# Patient Record
Sex: Male | Born: 1950
Health system: Southern US, Community
[De-identification: ages and names within clinical notes are randomized; demographics above are authoritative.]

## PROBLEM LIST (undated history)

## (undated) DIAGNOSIS — F329 Major depressive disorder, single episode, unspecified: Secondary | ICD-10-CM

## (undated) DIAGNOSIS — M1990 Unspecified osteoarthritis, unspecified site: Secondary | ICD-10-CM

## (undated) DIAGNOSIS — H269 Unspecified cataract: Secondary | ICD-10-CM

## (undated) DIAGNOSIS — D689 Coagulation defect, unspecified: Secondary | ICD-10-CM

## (undated) DIAGNOSIS — K219 Gastro-esophageal reflux disease without esophagitis: Secondary | ICD-10-CM

## (undated) DIAGNOSIS — Z5189 Encounter for other specified aftercare: Secondary | ICD-10-CM

## (undated) DIAGNOSIS — E7800 Pure hypercholesterolemia, unspecified: Secondary | ICD-10-CM

## (undated) DIAGNOSIS — T7840XA Allergy, unspecified, initial encounter: Secondary | ICD-10-CM

## (undated) DIAGNOSIS — I219 Acute myocardial infarction, unspecified: Secondary | ICD-10-CM

## (undated) DIAGNOSIS — F419 Anxiety disorder, unspecified: Secondary | ICD-10-CM

## (undated) DIAGNOSIS — Q600 Renal agenesis, unilateral: Secondary | ICD-10-CM

## (undated) DIAGNOSIS — F32A Depression, unspecified: Secondary | ICD-10-CM

## (undated) DIAGNOSIS — I10 Essential (primary) hypertension: Secondary | ICD-10-CM

## (undated) HISTORY — DX: Anxiety disorder, unspecified: F41.9

## (undated) HISTORY — DX: Unspecified cataract: H26.9

## (undated) HISTORY — DX: Encounter for other specified aftercare: Z51.89

## (undated) HISTORY — PX: VASECTOMY: SHX75

## (undated) HISTORY — PX: ANKLE FRACTURE SURGERY: SHX122

## (undated) HISTORY — DX: Renal agenesis, unilateral: Q60.0

## (undated) HISTORY — DX: Gastro-esophageal reflux disease without esophagitis: K21.9

## (undated) HISTORY — PX: BACK SURGERY: SHX140

## (undated) HISTORY — DX: Depression, unspecified: F32.A

## (undated) HISTORY — DX: Allergy, unspecified, initial encounter: T78.40XA

## (undated) HISTORY — DX: Unspecified osteoarthritis, unspecified site: M19.90

## (undated) HISTORY — DX: Acute myocardial infarction, unspecified: I21.9

## (undated) HISTORY — DX: Major depressive disorder, single episode, unspecified: F32.9

## (undated) HISTORY — DX: Coagulation defect, unspecified: D68.9

## (undated) HISTORY — DX: Pure hypercholesterolemia, unspecified: E78.00

## (undated) HISTORY — PX: TONSILLECTOMY: SUR1361

---

## 1994-11-06 HISTORY — PX: SPINE SURGERY: SHX786

## 2010-07-08 DIAGNOSIS — F329 Major depressive disorder, single episode, unspecified: Secondary | ICD-10-CM | POA: Insufficient documentation

## 2011-05-22 ENCOUNTER — Ambulatory Visit (INDEPENDENT_AMBULATORY_CARE_PROVIDER_SITE_OTHER): Payer: BC Managed Care – PPO | Admitting: Gastroenterology

## 2011-05-22 ENCOUNTER — Encounter: Payer: Self-pay | Admitting: Gastroenterology

## 2011-05-22 ENCOUNTER — Ambulatory Visit (HOSPITAL_COMMUNITY)
Admission: RE | Admit: 2011-05-22 | Discharge: 2011-05-22 | Disposition: A | Discharge status: Home / Self Care | Payer: BC Managed Care – PPO | Source: Ambulatory Visit | Attending: Gastroenterology | Admitting: Gastroenterology

## 2011-05-22 DIAGNOSIS — R110 Nausea: Secondary | ICD-10-CM | POA: Insufficient documentation

## 2011-05-22 DIAGNOSIS — R1012 Left upper quadrant pain: Secondary | ICD-10-CM | POA: Insufficient documentation

## 2011-05-22 DIAGNOSIS — K5904 Chronic idiopathic constipation: Secondary | ICD-10-CM | POA: Insufficient documentation

## 2011-05-22 DIAGNOSIS — R933 Abnormal findings on diagnostic imaging of other parts of digestive tract: Secondary | ICD-10-CM | POA: Insufficient documentation

## 2011-05-22 DIAGNOSIS — K5730 Diverticulosis of large intestine without perforation or abscess without bleeding: Secondary | ICD-10-CM | POA: Insufficient documentation

## 2011-05-22 DIAGNOSIS — K5900 Constipation, unspecified: Secondary | ICD-10-CM

## 2011-05-22 DIAGNOSIS — K5909 Other constipation: Secondary | ICD-10-CM | POA: Insufficient documentation

## 2011-05-22 DIAGNOSIS — R6881 Early satiety: Secondary | ICD-10-CM

## 2011-05-22 LAB — CBC WITH DIFFERENTIAL/PLATELET
Eosinophils Absolute: 0.1 10*3/uL (ref 0.0–0.7)
Eosinophils Relative: 2 % (ref 0–5)
HCT: 51.2 % (ref 39.0–52.0)
Hemoglobin: 17.7 g/dL — ABNORMAL HIGH (ref 13.0–17.0)
Lymphs Abs: 1.8 10*3/uL (ref 0.7–4.0)
MCH: 30 pg (ref 26.0–34.0)
MCV: 86.8 fL (ref 78.0–100.0)
Monocytes Absolute: 0.4 10*3/uL (ref 0.1–1.0)
Monocytes Relative: 6 % (ref 3–12)
Platelets: 191 10*3/uL (ref 150–400)
RBC: 5.9 MIL/uL — ABNORMAL HIGH (ref 4.22–5.81)

## 2011-05-22 LAB — COMPREHENSIVE METABOLIC PANEL
Alkaline Phosphatase: 75 U/L (ref 39–117)
Glucose, Bld: 109 mg/dL — ABNORMAL HIGH (ref 70–99)
Sodium: 140 mEq/L (ref 135–145)
Total Bilirubin: 0.6 mg/dL (ref 0.3–1.2)
Total Protein: 7.4 g/dL (ref 6.0–8.3)

## 2011-05-22 MED ORDER — POLYETHYLENE GLYCOL 3350 GRAN: 17.0000 g | GRANULES | Freq: Every day | Status: DC | PRN

## 2011-05-22 NOTE — Assessment & Plan Note (Signed)
Refer to assessment and plan for left upper quadrant pain.

## 2011-05-22 NOTE — Assessment & Plan Note (Signed)
Chronic left upper quadrant pain, gradually worsening. Pain worse with movement. Increased pressure with meals. Complains of chronic constipation but does not note any change in his pain associated with his bowel movements. Etiology not well defined at this time. Recommend CT of the abdomen pelvis with oral contrast only. Patient has shortness of breath in the past with IV contrast and has a solitary kidney as well. We discussed that there would be some limitations of the CT scan without IV contrast and he is agreeable to proceed. Check labs. Add MiraLax 17 g daily. If CT is unrevealing, then consider colonoscopy and upper endoscopy for further evaluation of chronic constipation, abdominal pain, history of polyps, early satiety and nausea. Family history significant for peptic ulcer disease, father had partial gastrectomy in his 46s.

## 2011-05-22 NOTE — Progress Notes (Signed)
Cc to PCP 

## 2011-05-22 NOTE — Progress Notes (Signed)
Primary Care Physician:  Ernestine Conrad, MD  Primary Gastroenterologist:  [name], MD  Chief Complaint  Patient presents with  . Abdominal Pain  . Nausea    HPI:  Terry Powell is a 60 y.o. male here self-referral for further evaluation LUQ pain. Pain also radiates into the back. Worse with movements. Not really associated with abdominal pain or bowel movements although he does note increased pressure when he eats. Pain for two months. Sometimes pain is like an ache, stabbing at times. Limits food intake at any one time because of increased pressure. Complains of early satiety. Nausea but no vomiting. No heartburn, indigestion. No dysphagia. Takes Metamucil powder all the time, looks like it doesn't want to work.  BM twice per week. No brbpr, melena. No weight loss. Urine dark in morning. No dysuria or hesitancy. No fever or chills. No known injury.   TCS four years ago. History of polyps. Done in [place]. EGD done 10 years ago [facility], Dr. Linna Darner. No h/o PUD or Barrett's. Family history significant for peptic ulcer disease.   Current Outpatient Prescriptions  Medication Sig Dispense Refill  . COLCRYS 0.6 MG tablet Ad lib.      Marland Kitchen FLUoxetine (PROZAC) 20 MG capsule       . ibuprofen (ADVIL,MOTRIN) 200 MG tablet Take 200 mg by mouth every 6 (six) hours as needed. Rarely takes.       . Polyethylene Glycol 3350 GRAN Take 17 g by mouth daily as needed.  527 g  5  . simvastatin (ZOCOR) 20 MG tablet         Allergies as of 05/22/2011 - Review Complete 05/22/2011  Allergen Reaction Noted  . Contrast media (iodinated diagnostic agents) Shortness Of Breath and Other (See Comments) 05/22/2011    Past Medical History  Diagnosis Date  . Gout   . Hypercholesteremia   . Arthritis   . Depression     since back surgery  . Solitary kidney, congenital     Past Surgical History  Procedure Date  . Back surgery   . Ankle fracture surgery     Family History  Problem Relation Age of Onset  .  Ulcers Father 40    partial gastrectomy  . Heart disease Father 72    deceased  . Ulcers Mother     stomach  . Colon cancer Maternal Grandfather 80  . Liver disease Neg Hx     History   Social History  . Marital Status: Married    Spouse Name: N/A    Number of Children: 2  . Years of Education: N/A   Occupational History  . retired     Chartered loss adjuster   Social History Main Topics  . Smoking status: Never Smoker   . Smokeless tobacco: Not on file  . Alcohol Use: No  . Drug Use: No  . Sexually Active: Not on file   Other Topics Concern  . Not on file   Social History Narrative  . No narrative on file      ROS:  General: Negative for anorexia, weight loss, fever, chills, fatigue, weakness. Eyes: Negative for vision changes.  ENT: Negative for hoarseness, difficulty swallowing , nasal congestion. CV: Negative for chest pain, angina, palpitations, dyspnea on exertion, peripheral edema.  Respiratory: Negative for dyspnea at rest, dyspnea on exertion, cough, sputum, wheezing.  GI: See history of present illness. GU:  Negative for dysuria, hematuria, urinary incontinence, urinary frequency, nocturnal urination.  MS: Negative for joint pain. Intermittent chronic  low back pain.  Derm: Negative for rash or itching.  Neuro: Negative for weakness, abnormal sensation, seizure, frequent headaches, memory loss, confusion.  Psych: Negative for anxiety, depression, suicidal ideation, hallucinations.  Endo: Negative for unusual weight change.  Heme: Negative for bruising or bleeding. Allergy: Negative for rash or hives.    Physical Examination:  BP 144/92  Pulse 53  Temp(Src) 97.6 F (36.4 C) (Temporal)  Ht 5\' 9"  (1.753 m)  Wt 173 lb 6.4 oz (78.654 kg)  BMI 25.61 kg/m2   General: Well-nourished, well-developed in no acute distress.  Head: Normocephalic, atraumatic.   Eyes: Conjunctiva pink, no icterus. Mouth: Oropharyngeal mucosa moist and pink , no lesions erythema or  exudate. Neck: Supple without thyromegaly, masses, or lymphadenopathy.  Lungs: Clear to auscultation bilaterally.  Heart: Regular rate and rhythm, no murmurs rubs or gallops.  Abdomen: Bowel sounds are normal, mild left upper quadrant tenderness, nondistended, no hepatosplenomegaly or masses, no abdominal bruits or hernia , no rebound or guarding.  No tenderness with palpation of the rib cage margin. No CVA tenderness. Extremities: No lower extremity edema.  Neuro: Alert and oriented x 4 , grossly normal neurologically.  Skin: Warm and dry, no rash or jaundice.   Psych: Alert and cooperative, normal mood and affect.

## 2011-05-22 NOTE — Assessment & Plan Note (Signed)
Refer to assessment and plan for left upper quadrant pain. 

## 2011-05-25 ENCOUNTER — Encounter: Payer: Self-pay | Admitting: General Practice

## 2011-05-25 ENCOUNTER — Other Ambulatory Visit: Payer: Self-pay

## 2011-05-25 DIAGNOSIS — D582 Other hemoglobinopathies: Secondary | ICD-10-CM

## 2011-06-07 DIAGNOSIS — D126 Benign neoplasm of colon, unspecified: Secondary | ICD-10-CM

## 2011-06-07 DIAGNOSIS — K2970 Gastritis, unspecified, without bleeding: Secondary | ICD-10-CM

## 2011-06-07 DIAGNOSIS — K5730 Diverticulosis of large intestine without perforation or abscess without bleeding: Secondary | ICD-10-CM

## 2011-06-07 DIAGNOSIS — R109 Unspecified abdominal pain: Secondary | ICD-10-CM

## 2011-06-07 DIAGNOSIS — K2990 Gastroduodenitis, unspecified, without bleeding: Secondary | ICD-10-CM

## 2011-06-07 DIAGNOSIS — Z86010 Personal history of colonic polyps: Secondary | ICD-10-CM

## 2011-06-07 DIAGNOSIS — K222 Esophageal obstruction: Secondary | ICD-10-CM

## 2011-06-07 MED ORDER — SODIUM CHLORIDE 0.45 % IV SOLN
Freq: Once | INTRAVENOUS | Status: AC
  Administered 2011-06-08: 1000 mL via INTRAVENOUS

## 2011-06-08 ENCOUNTER — Encounter: Payer: BC Managed Care – PPO | Admitting: Gastroenterology

## 2011-06-08 ENCOUNTER — Encounter (HOSPITAL_COMMUNITY): Payer: Self-pay | Admitting: *Deleted

## 2011-06-08 ENCOUNTER — Encounter (HOSPITAL_COMMUNITY)
Admission: RE | Disposition: A | Discharge status: Home / Self Care | Payer: Self-pay | Source: Ambulatory Visit | Attending: Gastroenterology

## 2011-06-08 ENCOUNTER — Ambulatory Visit (HOSPITAL_COMMUNITY)
Admission: RE | Admit: 2011-06-08 | Discharge: 2011-06-08 | Disposition: A | Discharge status: Home / Self Care | Payer: BC Managed Care – PPO | Source: Ambulatory Visit | Attending: Gastroenterology | Admitting: Gastroenterology

## 2011-06-08 ENCOUNTER — Other Ambulatory Visit: Payer: Self-pay | Admitting: Gastroenterology

## 2011-06-08 DIAGNOSIS — R1310 Dysphagia, unspecified: Secondary | ICD-10-CM | POA: Insufficient documentation

## 2011-06-08 DIAGNOSIS — K5730 Diverticulosis of large intestine without perforation or abscess without bleeding: Secondary | ICD-10-CM | POA: Insufficient documentation

## 2011-06-08 DIAGNOSIS — D126 Benign neoplasm of colon, unspecified: Secondary | ICD-10-CM | POA: Insufficient documentation

## 2011-06-08 DIAGNOSIS — Z860100 Personal history of colon polyps, unspecified: Secondary | ICD-10-CM | POA: Insufficient documentation

## 2011-06-08 DIAGNOSIS — R110 Nausea: Secondary | ICD-10-CM | POA: Insufficient documentation

## 2011-06-08 DIAGNOSIS — K449 Diaphragmatic hernia without obstruction or gangrene: Secondary | ICD-10-CM | POA: Insufficient documentation

## 2011-06-08 DIAGNOSIS — K2980 Duodenitis without bleeding: Secondary | ICD-10-CM | POA: Insufficient documentation

## 2011-06-08 DIAGNOSIS — K222 Esophageal obstruction: Secondary | ICD-10-CM | POA: Insufficient documentation

## 2011-06-08 DIAGNOSIS — K648 Other hemorrhoids: Secondary | ICD-10-CM | POA: Insufficient documentation

## 2011-06-08 DIAGNOSIS — E7800 Pure hypercholesterolemia, unspecified: Secondary | ICD-10-CM | POA: Insufficient documentation

## 2011-06-08 DIAGNOSIS — R109 Unspecified abdominal pain: Secondary | ICD-10-CM | POA: Insufficient documentation

## 2011-06-08 HISTORY — PX: ESOPHAGEAL DILATION: SHX303

## 2011-06-08 HISTORY — DX: Essential (primary) hypertension: I10

## 2011-06-08 HISTORY — PX: ESOPHAGOGASTRODUODENOSCOPY: SHX5428

## 2011-06-08 HISTORY — PX: COLONOSCOPY: SHX5424

## 2011-06-08 SURGERY — COLONOSCOPY
Anesthesia: Moderate Sedation

## 2011-06-08 MED ORDER — MEPERIDINE HCL 100 MG/ML IJ SOLN
INTRAMUSCULAR | Status: DC | PRN
  Administered 2011-06-08 (×2): 50 mg via INTRAVENOUS
  Administered 2011-06-08: 25 mg via INTRAVENOUS

## 2011-06-08 MED ORDER — MINERAL OIL PO OIL
TOPICAL_OIL | ORAL | Status: AC
  Filled 2011-06-08: qty 30

## 2011-06-08 MED ORDER — OMEPRAZOLE 20 MG PO CPDR: DELAYED_RELEASE_CAPSULE | ORAL | Status: DC

## 2011-06-08 MED ORDER — MIDAZOLAM HCL 5 MG/5ML IJ SOLN
INTRAMUSCULAR | Status: AC
  Filled 2011-06-08: qty 10

## 2011-06-08 MED ORDER — MIDAZOLAM HCL 5 MG/5ML IJ SOLN
INTRAMUSCULAR | Status: DC | PRN
  Administered 2011-06-08: 2 mg via INTRAVENOUS
  Administered 2011-06-08: 1 mg via INTRAVENOUS
  Administered 2011-06-08 (×2): 2 mg via INTRAVENOUS

## 2011-06-08 MED ORDER — MEPERIDINE HCL 100 MG/ML IJ SOLN
INTRAMUSCULAR | Status: AC
  Filled 2011-06-08: qty 2

## 2011-06-08 NOTE — H&P (Signed)
Reason for Visit     Abdominal Pain    Nausea        Current Vitals       Recorded User        05/22/2011  9:24 AM  Trudee Kuster, LPN           BP Pulse Temp (Src) Resp Ht Wt    144/92  53  97.6 F (36.4 C) (Temporal)  N/A  5\' 9"  (1.753 m)  173 lb 6.4 oz (78.654 kg)       BMI SpO2 PF    25.61 kg/m2  N/A  N/A          Progress Notes     Tana Coast, PA  05/22/2011  9:48 AM  Signed Primary Care Physician:  Ernestine Conrad, MD   Primary Gastroenterologist:  [name], MD    Chief Complaint   Patient presents with   .  Abdominal Pain   .  Nausea      HPI:  Terry Powell is a 60 y.o. male here self-referral for further evaluation LUQ pain. Pain also radiates into the back. Worse with movements. Not really associated with abdominal pain or bowel movements although he does note increased pressure when he eats. Pain for two months. Sometimes pain is like an ache, stabbing at times. Limits food intake at any one time because of increased pressure. Complains of early satiety. Nausea but no vomiting. No heartburn, indigestion. No dysphagia. Takes Metamucil powder all the time, looks like it doesn't want to work.  BM twice per week. No brbpr, melena. No weight loss. Urine dark in morning. No dysuria or hesitancy. No fever or chills. No known injury.    TCS four years ago. History of polyps. Done in [place]. EGD done 10 years ago [facility], Dr. Linna Darner. No h/o PUD or Barrett's. Family history significant for peptic ulcer disease.     Current Outpatient Prescriptions   Medication  Sig  Dispense  Refill   .  COLCRYS 0.6 MG tablet  Ad lib.         Marland Kitchen  FLUoxetine (PROZAC) 20 MG capsule           .  ibuprofen (ADVIL,MOTRIN) 200 MG tablet  Take 200 mg by mouth every 6 (six) hours as needed. Rarely takes.          .  Polyethylene Glycol 3350 GRAN  Take 17 g by mouth daily as needed.   527 g   5   .  simvastatin (ZOCOR) 20 MG tablet               Allergies as of 05/22/2011 -  Review Complete 05/22/2011   Allergen  Reaction  Noted   .  Contrast media (iodinated diagnostic agents)  Shortness Of Breath and Other (See Comments)  05/22/2011       Past Medical History   Diagnosis  Date   .  Gout     .  Hypercholesteremia     .  Arthritis     .  Depression         since back surgery   .  Solitary kidney, congenital         Past Surgical History   Procedure  Date   .  Back surgery     .  Ankle fracture surgery         Family History   Problem  Relation  Age of Onset   .  Ulcers  Father  40       partial gastrectomy   .  Heart disease  Father  86       deceased   .  Ulcers  Mother         stomach   .  Colon cancer  Maternal Grandfather  80   .  Liver disease  Neg Hx         History       Social History   .  Marital Status:  Married       Spouse Name:  N/A       Number of Children:  2   .  Years of Education:  N/A       Occupational History   .  retired         Chartered loss adjuster       Social History Main Topics   .  Smoking status:  Never Smoker    .  Smokeless tobacco:  Not on file   .  Alcohol Use:  No   .  Drug Use:  No   .  Sexually Active:  Not on file       Other Topics  Concern   .  Not on file       Social History Narrative   .  No narrative on file        ROS:   General: Negative for anorexia, weight loss, fever, chills, fatigue, weakness. Eyes: Negative for vision changes.   ENT: Negative for hoarseness, difficulty swallowing , nasal congestion. CV: Negative for chest pain, angina, palpitations, dyspnea on exertion, peripheral edema.   Respiratory: Negative for dyspnea at rest, dyspnea on exertion, cough, sputum, wheezing.   GI: See history of present illness. GU:  Negative for dysuria, hematuria, urinary incontinence, urinary frequency, nocturnal urination.   MS: Negative for joint pain. Intermittent chronic low back pain.   Derm: Negative for rash or itching.   Neuro: Negative for weakness, abnormal sensation,  seizure, frequent headaches, memory loss, confusion.   Psych: Negative for anxiety, depression, suicidal ideation, hallucinations.   Endo: Negative for unusual weight change.   Heme: Negative for bruising or bleeding. Allergy: Negative for rash or hives.     Physical Examination:   BP 144/92  Pulse 53  Temp(Src) 97.6 F (36.4 C) (Temporal)  Ht 5\' 9"  (1.753 m)  Wt 173 lb 6.4 oz (78.654 kg)  BMI 25.61 kg/m2    General: Well-nourished, well-developed in no acute distress.   Head: Normocephalic, atraumatic.    Eyes: Conjunctiva pink, no icterus. Mouth: Oropharyngeal mucosa moist and pink , no lesions erythema or exudate. Neck: Supple without thyromegaly, masses, or lymphadenopathy.   Lungs: Clear to auscultation bilaterally.   Heart: Regular rate and rhythm, no murmurs rubs or gallops.   Abdomen: Bowel sounds are normal, mild left upper quadrant tenderness, nondistended, no hepatosplenomegaly or masses, no abdominal bruits or hernia , no rebound or guarding.  No tenderness with palpation of the rib cage margin. No CVA tenderness. Extremities: No lower extremity edema.   Neuro: Alert and oriented x 4 , grossly normal neurologically.   Skin: Warm and dry, no rash or jaundice.    Psych: Alert and cooperative, normal mood and affect.          Glendora Score  05/22/2011 11:26 AM  Signed Cc to PCP        LUQ pain - Tana Coast, PA  05/22/2011  9:31 AM  Signed Chronic left upper quadrant pain, gradually worsening. Pain worse with movement. Increased pressure with meals. Complains of chronic constipation but does not note any change in his pain associated with his bowel movements. Etiology not well defined at this time. Recommend CT of the abdomen pelvis with oral contrast only. Patient has shortness of breath in the past with IV contrast and has a solitary kidney as well. We discussed that there would be some limitations of the CT scan without IV contrast and he is agreeable to proceed.  Check labs. Add MiraLax 17 g daily. If CT is unrevealing, then consider colonoscopy and upper endoscopy for further evaluation of chronic constipation, abdominal pain, history of polyps, early satiety and nausea. Family history significant for peptic ulcer disease, father had partial gastrectomy in his 17s.  Early satiety - Tana Coast, PA  05/22/2011  9:31 AM  Signed Refer to assessment and plan for left upper quadrant pain.  Chronic constipation - Tana Coast, PA  05/22/2011  9:31 AM  Signed Refer to assessment and plan for left upper quadrant pain.  Nausea alone - Tana Coast, PA  05/22/2011  9:31 AM  Signed Refer to assessment and plan for left upper quadrant pain.

## 2011-06-08 NOTE — Interval H&P Note (Signed)
History and Physical Interval Note:   06/08/2011   8:16 AM   Terry Powell  has presented today for surgery, with the diagnosis of luq pain and nausea, constipation  The various methods of treatment have been discussed with the patient and family. After consideration of risks, benefits and other options for treatment, the patient has consented to  Procedure(s): COLONOSCOPY ESOPHAGOGASTRODUODENOSCOPY (EGD) as a surgical intervention .  I have reviewed the patients' chart and labs.  Questions were answered to the patient's satisfaction.     [name]  MD

## 2011-06-09 NOTE — Progress Notes (Signed)
AGREE

## 2011-06-13 ENCOUNTER — Telehealth: Payer: Self-pay | Admitting: Gastroenterology

## 2011-06-13 NOTE — Telephone Encounter (Signed)
Results Cc to PCP  

## 2011-06-13 NOTE — Telephone Encounter (Signed)
PLEASE CALL Pt. His biopsies showed MILD GASTRITIS. He had one large SIMPLE ADENOMA REMOVED. hIGH FIBER DIET.TCS in 5 years. All first degree relatives need TCS at age 59.

## 2011-06-14 ENCOUNTER — Encounter (HOSPITAL_COMMUNITY): Payer: Self-pay | Admitting: Gastroenterology

## 2011-06-14 NOTE — Telephone Encounter (Signed)
Pt aware.

## 2011-06-26 NOTE — Telephone Encounter (Signed)
Reminder in epic to have tcs done in 5 years

## 2011-06-29 ENCOUNTER — Inpatient Hospital Stay (HOSPITAL_COMMUNITY)
Admission: AD | Admit: 2011-06-29 | Discharge: 2011-07-03 | DRG: 122 | Disposition: A | Discharge status: Home / Self Care | Payer: BC Managed Care – PPO | Source: Other Acute Inpatient Hospital | Attending: Cardiovascular Disease | Admitting: Cardiovascular Disease

## 2011-06-29 DIAGNOSIS — IMO0002 Reserved for concepts with insufficient information to code with codable children: Secondary | ICD-10-CM | POA: Diagnosis not present

## 2011-06-29 DIAGNOSIS — I2510 Atherosclerotic heart disease of native coronary artery without angina pectoris: Secondary | ICD-10-CM | POA: Diagnosis present

## 2011-06-29 DIAGNOSIS — E785 Hyperlipidemia, unspecified: Secondary | ICD-10-CM | POA: Diagnosis present

## 2011-06-29 DIAGNOSIS — Z8249 Family history of ischemic heart disease and other diseases of the circulatory system: Secondary | ICD-10-CM

## 2011-06-29 DIAGNOSIS — I214 Non-ST elevation (NSTEMI) myocardial infarction: Principal | ICD-10-CM | POA: Diagnosis present

## 2011-06-29 DIAGNOSIS — I498 Other specified cardiac arrhythmias: Secondary | ICD-10-CM | POA: Diagnosis not present

## 2011-06-29 DIAGNOSIS — R7309 Other abnormal glucose: Secondary | ICD-10-CM | POA: Diagnosis present

## 2011-06-29 DIAGNOSIS — F329 Major depressive disorder, single episode, unspecified: Secondary | ICD-10-CM | POA: Diagnosis present

## 2011-06-29 DIAGNOSIS — Y840 Cardiac catheterization as the cause of abnormal reaction of the patient, or of later complication, without mention of misadventure at the time of the procedure: Secondary | ICD-10-CM | POA: Diagnosis not present

## 2011-06-29 DIAGNOSIS — Z860100 Personal history of colon polyps, unspecified: Secondary | ICD-10-CM

## 2011-06-29 DIAGNOSIS — R079 Chest pain, unspecified: Secondary | ICD-10-CM

## 2011-06-29 DIAGNOSIS — I10 Essential (primary) hypertension: Secondary | ICD-10-CM | POA: Diagnosis present

## 2011-06-29 DIAGNOSIS — F3289 Other specified depressive episodes: Secondary | ICD-10-CM | POA: Diagnosis present

## 2011-06-30 DIAGNOSIS — I2510 Atherosclerotic heart disease of native coronary artery without angina pectoris: Secondary | ICD-10-CM

## 2011-06-30 LAB — CBC
MCV: 83.8 fL (ref 78.0–100.0)
Platelets: 186 10*3/uL (ref 150–400)
RDW: 13.2 % (ref 11.5–15.5)
WBC: 8.7 10*3/uL (ref 4.0–10.5)

## 2011-06-30 LAB — BASIC METABOLIC PANEL
CO2: 28 mEq/L (ref 19–32)
Calcium: 9.6 mg/dL (ref 8.4–10.5)
Creatinine, Ser: 1.05 mg/dL (ref 0.50–1.35)

## 2011-06-30 LAB — LIPID PANEL
Cholesterol: 198 mg/dL (ref 0–200)
LDL Cholesterol: 125 mg/dL — ABNORMAL HIGH (ref 0–99)
VLDL: 29 mg/dL (ref 0–40)

## 2011-06-30 LAB — HEMOGLOBIN A1C: Hgb A1c MFr Bld: 5.8 % — ABNORMAL HIGH (ref <5.7)

## 2011-06-30 LAB — CARDIAC PANEL(CRET KIN+CKTOT+MB+TROPI)
CK, MB: 11.1 ng/mL (ref 0.3–4.0)
CK, MB: 13.5 ng/mL (ref 0.3–4.0)
Relative Index: 5.9 — ABNORMAL HIGH (ref 0.0–2.5)
Total CK: 187 U/L (ref 7–232)
Total CK: 188 U/L (ref 7–232)
Troponin I: 1.63 ng/mL (ref <0.30)

## 2011-06-30 LAB — PROTIME-INR: INR: 1.04 (ref 0.00–1.49)

## 2011-07-01 DIAGNOSIS — I214 Non-ST elevation (NSTEMI) myocardial infarction: Secondary | ICD-10-CM

## 2011-07-01 LAB — BASIC METABOLIC PANEL
BUN: 21 mg/dL (ref 6–23)
CO2: 25 mEq/L (ref 19–32)
Chloride: 105 mEq/L (ref 96–112)
Creatinine, Ser: 1.05 mg/dL (ref 0.50–1.35)
Glucose, Bld: 130 mg/dL — ABNORMAL HIGH (ref 70–99)

## 2011-07-01 LAB — CBC
HCT: 40.6 % (ref 39.0–52.0)
Hemoglobin: 14.4 g/dL (ref 13.0–17.0)
MCH: 29.8 pg (ref 26.0–34.0)
MCV: 84.1 fL (ref 78.0–100.0)
RBC: 4.83 MIL/uL (ref 4.22–5.81)

## 2011-07-02 LAB — CBC
MCV: 84.7 fL (ref 78.0–100.0)
Platelets: 183 10*3/uL (ref 150–400)
RDW: 13.6 % (ref 11.5–15.5)
WBC: 10.2 10*3/uL (ref 4.0–10.5)

## 2011-07-03 LAB — CBC
MCH: 30 pg (ref 26.0–34.0)
Platelets: 206 10*3/uL (ref 150–400)
RBC: 5.1 MIL/uL (ref 4.22–5.81)
RDW: 13.2 % (ref 11.5–15.5)

## 2011-07-03 LAB — BASIC METABOLIC PANEL
BUN: 16 mg/dL (ref 6–23)
Calcium: 9.6 mg/dL (ref 8.4–10.5)
Creatinine, Ser: 0.81 mg/dL (ref 0.50–1.35)
GFR calc non Af Amer: >60 mL/min (ref >60)
Glucose, Bld: 176 mg/dL — ABNORMAL HIGH (ref 70–99)
Sodium: 137 mEq/L (ref 135–145)

## 2011-07-03 LAB — PROTIME-INR: Prothrombin Time: 14 seconds (ref 11.6–15.2)

## 2011-07-04 NOTE — Cardiovascular Report (Signed)
  NAME:  ELAINE, MIDDLETON NO.:  [id number]  MEDICAL RECORD NO.:  [id number]  LOCATION:  2912                         FACILITY:  [facility]  PHYSICIAN:  [name]. Eden Emms, MD, FACCDATE OF BIRTH:  1951-10-05  DATE OF PROCEDURE: DATE OF DISCHARGE:                           CARDIAC CATHETERIZATION   Terry Powell is a 60 year old patient of Dr. Shirlee Latch admitted to the hospital with subendocardial MI.  Cine catheterization was done with 5-French sheath from the right femoral artery and the patient oozed throughout the case.  There was a mild hematoma at the end of the case.  Left main coronary artery was normal.  Left anterior descending artery appeared to have a moderate 50% lesion in the proximal LAD.  There appeared to be mobile thrombus just proximal to the septal perforator.  There was TIMI 3 flow and the mid and distal LAD were normal.  There was a high takeoff diagonal branch which was normal and second diagonal branch was normal.  Circumflex coronary artery was nondominant and somewhat tortuous. Proximal, mid and distal circ were normal.  First and second obtuse marginal branches were normal.  Right coronary artery was large and dominant.  It was normal.  There were no right-to-left collaterals.  RAO ventriculography:  RAO ventriculography was normal.  There was no wall motion abnormality.  EF was 55%.  Aortic pressure was 110/79, LV pressure was 110/2 with a post A-wave EDP of 7.  IMPRESSION:  The films were reviewed with Dr. Shirlee Latch.  He agreed that there appeared to be mobile thrombus in the proximal left anterior descending.  The patient will be kept on Brilinta and heparin over the weekend.  He will have a relook cath on Monday.  At the beginning of the case, the lesion in the proximal LAD seem to closer to 70 or 80%, however, by the end of the case there did not appear to be a severe fixed lesion with mostly mobile thrombus.  Fortunately, during the  case and injections of the left main, there was no distal embolization.  The patient has had significantly positive enzymes and I suspect this is his culprit lesion.    [name]. Eden Emms, MD, [facility]    PCN/MEDQ  D:  06/30/2011  T:  06/30/2011  Job:  [id number]  Electronically Signed by Charlton Haws MD [facility] on 07/04/2011 02:04:17 PM

## 2011-07-22 NOTE — H&P (Signed)
NAME:  Terry Powell, Terry Powell NO.:  [id number]  MEDICAL RECORD NO.:  [id number]  LOCATION:  2912                         FACILITY:  [facility]  PHYSICIAN:  Marca Ancona, MD      DATE OF BIRTH:  11/27/1950  DATE OF ADMISSION:  06/29/2011 DATE OF DISCHARGE:                             HISTORY & PHYSICAL   PRIMARY CARE PHYSICIAN:  Ernestine Conrad, MD  HISTORY OF PRESENT ILLNESS:  This is a 60 year old with history of hyperlipidemia and a strong family history of premature coronary artery disease who presents with chest pain.  The patient is working on [employer] today, he was doing some heavy lifting.  He developed some substernal chest tightness that was fairly severe, lying down did not relieve it.  The chest tightness radiated up into his neck.  He went to the ER at [facility], eventually the pain eased off after about 2 hours with nitroglycerin and morphine.  The patient is still even now has about 1/10 pain.  The patient has had no recent prior chest pain, but he does get short of breath walking up steps or walking about a quarter of mile.  He had a stress test 3 or 4 years ago with a prior episode of chest pain that was normal per his report.  Also of note, he stopped taking aspirin over 2 weeks ago for colonoscopy and an EGD.  MEDICATIONS: 1. Zocor 40 mg daily. 2. Prozac 20 mg daily.  ALLERGIES:  IV CONTRAST.  PAST MEDICAL HISTORY: 1. Probable hypertension, no prior diagnosis. 2. Hyperlipidemia. 3. Depression. 4. EGD 2 weeks ago with esophageal stricture and dilation. 5. Colonoscopy 2 weeks ago, polypectomy.  SOCIAL HISTORY:  The patient lives between [place] and [place] with his wife.  He is a retired Chartered loss adjuster.  He does not smoke.  Rare alcohol.  FAMILY HISTORY:  Father died at 65 of an MI.  He has first MI at age of 64.  REVIEW OF SYSTEMS:  All systems were reviewed and were negative except as noted in the history of present illness.  PHYSICAL  EXAMINATION:  VITAL SIGNS:  Temperature 98.6, pulses in the 60s and regular, blood pressure initially at [facility] was 160/113, currently is 142/91, oxygen saturation 97% on 2 liters nasal cannula. GENERAL:  He is a well-developed male, in no apparent distress. NEUROLOGIC:  Alert and orient x3.  Normal affect. ABDOMEN:  Soft, nontender.  No hepatosplenomegaly.  Normal bowel sounds. NECK:  There is no thyromegaly or thyroid nodule.  There is no JVD. CARDIOVASCULAR:  Heart regular S1 and S2.  No S3, no S4.  There is no murmur.  There are 2+ posterior tibial pulses bilaterally.  There is no carotid bruit.  There is no peripheral edema. EXTREMITIES:  No clubbing or cyanosis. LUNGS:  Clear to auscultation bilaterally.  Normal respiratory effort. MUSCULOSKELETAL:  Normal exam. SKIN:  Normal exam.  EKG shows normal sinus rhythm and left ventricular hypertrophy with an early transition.  The R is greater than the S in V2.  LABS:  White count 9.2, hematocrit 52.5, platelets 217.  Potassium 3.8, creatinine 1.12, D-dimer less than 0.22.  First set of cardiac enzymes negative.  IMPRESSION:  The patient is a 60 year old with history of hyperlipidemia, family history of premature coronary artery disease, and possible hypertension, who presents with symptoms consistent with an unstable angina.  The patient now has about 1/10 chest pain.  His EKG shows an early transition with R greater than S in V2 than on V1.  His first set of cardiac enzymes were negative.  We will repeat cardiac enzymes now and every 6 hours.  We will get a repeat EKG now.  We will start him on heparin drip and give him aspirin, metoprolol 25 mg q.6 hours, and Crestor 40 mg daily.  If his enzymes come back positive, we will add ticagrelor.  We will plan on left heart catheterization in the morning with premedication for contrast allergy.     Marca Ancona, MD     DM/MEDQ  D:  06/29/2011  T:  06/30/2011  Job:   [id number]  Electronically Signed by Marca Ancona MD on 07/22/2011 11:15:52 PM

## 2011-07-22 NOTE — Discharge Summary (Signed)
NAME:  Terry Powell, Terry Powell NO.:  [id number]  MEDICAL RECORD NO.:  [id number]  LOCATION:  2912                         FACILITY:  [facility]  PHYSICIAN:  Marca Ancona, MD      DATE OF BIRTH:  April 19, 1951  DATE OF ADMISSION:  06/29/2011 DATE OF DISCHARGE:  07/03/2011                              DISCHARGE SUMMARY   PROCEDURES: 1. Cardiac catheterization. 2. Coronary arteriogram. 3. Left ventriculogram. 4. Recatheterization on July 03, 2011, with coronary angiography.  PRIMARY FINAL DISCHARGE DIAGNOSIS:  Subendocardial myocardial infarction.  SECONDARY DIAGNOSES: 1. Hyperlipidemia with a total cholesterol of 198, triglycerides 143,     HDL 44, LDL 125. 2. Hyperglycemia.  The fasting blood sugar ranging between 88 and 176,     hemoglobin A1c 5.8. 3. Leukocytosis, felt secondary to his myocardial infarction. 4. Status post esophagogastroduodenoscopy and colonoscopy. 5. Probable hypertension. 6. Depression. 7. History of esophageal stricture and dilatation. 8. History of colon polyps, removed. 9. Family history of coronary artery disease in his father.  TIME AT DISCHARGE:  Thirty-six minutes.  HOSPITAL COURSE:  Mr. Prabhakar is a 60 year old male with no previous history of coronary artery disease.  He developed chest pain with exertion on the day of admission and went to [facility].  His chest pain improved with nitroglycerin and morphine.  His first set of cardiac enzymes was negative.  Mr. Gingras was transferred from [facility] to [facility].  At [facility], his cardiac enzymes elevated indicating a non-ST segment elevation MI that was felt subendocardial.  He was taken to the cath lab on June 30, 2011.  The cardiac catheterization showed a mobile thrombus in the LAD just proximal to the septal perforator.  There was TIMI 3 flow.  The circumflex and RCA systems are normal.  His EF was 55%.  It was recommended that the patient be kept on Brilinta and heparin.  A  staged relook catheterization was planned. There was no distal embolization. Dr. Eden Emms, felt this was the culprit lesion.  Mr. Okane was kept on heparin and Brilinta.  He had some oozing from his cath site in his right groin which was treated with direct pressure and a pressure dressing.  He was on a statin as well as a beta-blocker and an aspirin.  On July 03, 2011, he was premedicated for CONTRAST allergy.  His metoprolol dose was decreased because of bradycardia.  He was ambulating with the cardiac rehab and had no chest pain or shortness of breath.  The relook catheterization showed no more than a 25% lesion in the LAD.  He had luminal irregularities in the RCA which was dominant.  There was no other significant disease.  His beta-blocker had been discontinued because of bradycardia.  Post procedure, Dr. Shirlee Latch evaluated Mr. Carneal, and consider him stable for discharge, to follow up as an outpatient.  DISCHARGE INSTRUCTIONS:  His activity level is to be increased gradually with no driving for 3 days and no lifting for 3 weeks.  He is to call our office for problems with the cath site.  He will be offered follow up in the [place] office or he can follow up with Dr. Shirlee Latch in [place]. He is  to follow up with Dr. Loney Hering as needed.  DISCHARGE MEDICATIONS: 1. Prozac 20 mg a day. 2. Zocor 40 mg is discontinued. 3. Lipitor 80 mg a day. 4. Sublingual nitroglycerin p.r.n. 5. Aspirin 81 mg a day. 6. Aleve b.i.d. p.r.n. 7. Brilinta 90 mg b.i.d.     Theodore Demark, PA-C   ______________________________ Marca Ancona, MD    RB/MEDQ  D:  07/03/2011  T:  07/04/2011  Job:  [id number]  cc:   Ernestine Conrad, MD  Electronically Signed by Theodore Demark PA-C on 07/11/2011 02:02:36 PM Electronically Signed by Marca Ancona MD on 07/22/2011 11:15:48 PM

## 2011-07-22 NOTE — Cardiovascular Report (Signed)
NAME:  Terry Powell, BORNTREGER NO.:  [id number]  MEDICAL RECORD NO.:  [id number]  LOCATION:  2912                         FACILITY:  [facility]  PHYSICIAN:  Marca Ancona, MD      DATE OF BIRTH:  [birth year]  DATE OF PROCEDURE:  07/03/2011 DATE OF DISCHARGE:  07/03/2011                           CARDIAC CATHETERIZATION   PROCEDURE:  Coronary angiography.  INDICATIONS:  This is a relook catheterization to assess for thrombus in the LAD which was seen on catheterization last week.  The patient had presented with non-ST elevation MI.  PROCEDURE NOTE:  After informed consent was obtained, the patient underwent an Freida Busman testing of his right wrist.  Right ulnar artery gave good collateral circulation to the radial side of the hand, constituting a positive Allen test.  The right radial area was sterilely prepped and draped. 1% Lidocaine was used to locally anesthetize the right radial area.  The right radial artery was entered using modified Seldinger technique and a 5-French arterial sheath was placed.  The right coronary artery was engaged using JR-4 catheter.  Left coronary artery was engaged using the JL-3.5 catheter.  Left ventriculography was not done as he had LV gram on his cath last week that showed normal systolic function.  The patient did receive 4 mg of intraarterial verapamil and 4000 units of IV heparin at the time of  arterial access.  FINDINGS: 1. Right coronary artery.  The right coronary artery is a dominant     vessel with luminal irregularities. 2. Left main.  The left main had no angiographic coronary artery     disease. 3. Left circumflex artery.  There was a moderate-sized ramus and in     the circumflex system had 2 obtuse marginals, there was minimal     disease.  The circumflex was rather tortuous. 4. LAD system.  There was about a 20% proximal LAD stenosis before the     first septal perforator and an area of about 20-25% LAD stenosis just  beyond the first septal perforator .  All of this was in  the     proximal LAD.  The remainder of the LAD has no significant disease.     The mobile thrombus that was seen in the proximal LAD just prior to the     septal perforator on the last left heart catheterization was not     seen on today's catheterization, suggesting that the thrombus has     resolved.  IMPRESSION:  Thrombus in the left anterior descending coronary artery appears to have resolved.  There is minimal residual proximal left anterior descending coronary artery stenosis.  It will be okay for the patient to go home today.  He will continue on aspirin 81 and ticagrelor. 90 b.i.d. I will have him on  Lipitor 80 mg daily.  We are going to stop metoprolol with decreased heart rate.  He is going to follow up with me in 2 weeks.     Marca Ancona, MD     DM/MEDQ  D:  07/03/2011  T:  07/03/2011  Job:  [id number]  cc:   Ernestine Conrad, MD  Electronically Signed by Marca Ancona MD  on 07/22/2011 11:18:47 PM

## 2011-07-25 ENCOUNTER — Encounter: Payer: Self-pay | Admitting: Cardiology

## 2011-07-26 ENCOUNTER — Ambulatory Visit (INDEPENDENT_AMBULATORY_CARE_PROVIDER_SITE_OTHER): Payer: BC Managed Care – PPO | Admitting: Cardiology

## 2011-07-26 VITALS — BP 126/90 | HR 51 | Resp 12 | Ht 68.0 in | Wt 165.0 lb

## 2011-07-26 DIAGNOSIS — E785 Hyperlipidemia, unspecified: Secondary | ICD-10-CM

## 2011-07-26 DIAGNOSIS — I2510 Atherosclerotic heart disease of native coronary artery without angina pectoris: Secondary | ICD-10-CM

## 2011-07-26 MED ORDER — RAMIPRIL 2.5 MG PO CAPS: 2.5000 mg | ORAL_CAPSULE | Freq: Every day | ORAL | Status: DC

## 2011-07-26 NOTE — Patient Instructions (Addendum)
Take aspirin 81mg  daily.  Start ramipril 2.5mg  daily.  Call cardiac rehab at [facility] to get started in their program.  Your physician recommends that you have  lab work in: 2 months--lipid profile/liver profile/BMP --you have the order. Please fax the results to Dr Shirlee Latch at [phone].  Your physician recommends that you schedule a follow-up appointment in: 3 months with Dr Shirlee Latch.

## 2011-07-27 ENCOUNTER — Encounter: Payer: Self-pay | Admitting: Cardiology

## 2011-07-27 DIAGNOSIS — I2510 Atherosclerotic heart disease of native coronary artery without angina pectoris: Secondary | ICD-10-CM | POA: Insufficient documentation

## 2011-07-27 DIAGNOSIS — E785 Hyperlipidemia, unspecified: Secondary | ICD-10-CM | POA: Insufficient documentation

## 2011-07-27 NOTE — Assessment & Plan Note (Signed)
Check lipids/LFTs in 2 months, goal LDL < 70.

## 2011-07-27 NOTE — Progress Notes (Signed)
PCP: Dr. Loney Hering in [place]  60 yo with history of HTN and hyperlipidemia presents to establish outpatient cardiology care after recent NSTEMI.  On 8/24, patient developed severe substernal chest pain.  He went to the ER at [facility].  Cardiac enzymes were positive, so he was sent to [facility].  Left heart cath showed EF 55% and plaque + significant mobile thrombus in the proximal LAD creating about 80% stenosis.  He was treated with ticagrelor and heparin gtt.  Re-look cath several days later showed only  20-25% proximal LAD stenosis and resolution of the thrombus.  Ticagrelor was continued.  Since getting home, he has been doing well.  No exertional chest pain or dyspnea.    ECG: NSR, LVH  Labs (8/12): K 3.7, creatinine 0.81, LDL 125, HDL 44  PMH: 1. Hyperlipidemia 2. HTN 3. Esophageal stricture 4. Colonic polyps 5. Depression 6. CAD: NSTEMI 8/12.  LHC with plaque and mobile thrombus with up to 80% stenosis in proximal LAD.  Patient was treated with ticagrelor and heparin.  Relook cath was done several days later.  This showed resolution of the mobile thrombus.  There was only 20-25% proximal LAD stenosis.  EF as 55% on LV-gram.   SH: Lives in [place], [place].  Married.  Never smoked.  Retired Chartered loss adjuster.    FH: Father died of MI at 66, had his 1st MI at 14.   ROS: All systems reviewed and negative except as per HPI.   Current Outpatient Prescriptions  Medication Sig Dispense Refill  . atorvastatin (LIPITOR) 80 MG tablet Take 1 tablet by mouth Daily.      Marland Kitchen BRILINTA 90 MG TABS tablet Take 1 tablet by mouth Twice daily.      Marland Kitchen COLCRYS 0.6 MG tablet as needed.       Marland Kitchen FLUoxetine (PROZAC) 20 MG capsule Take 20 mg by mouth daily.       Marland Kitchen NITROSTAT 0.4 MG SL tablet as needed.      Marland Kitchen omeprazole (PRILOSEC) 20 MG capsule 1 po 30 minute prior to your first meal  30 capsule  5  . Polyethylene Glycol 3350 GRAN Take 17 g by mouth daily as needed.  527 g  5  . aspirin EC 81 MG tablet Take 1 tablet (81  mg total) by mouth daily.      . ramipril (ALTACE) 2.5 MG capsule Take 1 capsule (2.5 mg total) by mouth daily.  30 capsule  6    BP 126/90  Pulse 51  Resp 12  Ht 5\' 8"  (1.727 m)  Wt 165 lb (74.844 kg)  BMI 25.09 kg/m2 General: NAD Neck: No JVD, no thyromegaly or thyroid nodule.  Lungs: Clear to auscultation bilaterally with normal respiratory effort. CV: Nondisplaced PMI.  Heart regular S1/S2, no S3/S4, no murmur.  No peripheral edema.  No carotid bruit.  Normal pedal pulses.  Abdomen: Soft, nontender, no hepatosplenomegaly, no distention.  Skin: Intact without lesions or rashes.  Neurologic: Alert and oriented x 3.  Psych: Normal affect. Extremities: No clubbing or cyanosis.  HEENT: Normal.

## 2011-07-27 NOTE — Assessment & Plan Note (Addendum)
NSTEMI with mobile thrombus superimposed on plaque in the proximal LAD.  After treatment with ticagrelor and heparin gtt, the thrombus resolved, leaving about 25% proximal LAD stenosis by plaque.  Continue ticagrelor for 1 year.  He will also continue on ASA 81 and statin.  He is bradycardic (HR around 50) so will not start a beta blocker.  I will start ramipril 2.5 mg daily today for secondary prevention.  He is going to start cardiac rehab at [facility].

## 2011-08-01 ENCOUNTER — Encounter: Payer: Self-pay | Admitting: Gastroenterology

## 2011-09-26 ENCOUNTER — Encounter: Payer: Self-pay | Admitting: Cardiology

## 2011-10-23 ENCOUNTER — Ambulatory Visit: Payer: BC Managed Care – PPO | Admitting: Cardiology

## 2011-11-21 ENCOUNTER — Ambulatory Visit (INDEPENDENT_AMBULATORY_CARE_PROVIDER_SITE_OTHER): Payer: BC Managed Care – PPO | Admitting: Cardiology

## 2011-11-21 ENCOUNTER — Encounter: Payer: Self-pay | Admitting: Cardiology

## 2011-11-21 VITALS — BP 105/77 | HR 50 | Ht 69.0 in | Wt 159.0 lb

## 2011-11-21 DIAGNOSIS — I2510 Atherosclerotic heart disease of native coronary artery without angina pectoris: Secondary | ICD-10-CM

## 2011-11-21 DIAGNOSIS — E785 Hyperlipidemia, unspecified: Secondary | ICD-10-CM

## 2011-11-21 NOTE — Assessment & Plan Note (Signed)
LDL at goal (< 70) in 11/12.

## 2011-11-21 NOTE — Patient Instructions (Signed)
Your physician recommends that you return for a FASTING lipid profile in 6 months.  Your physician wants you to follow-up in: 6 months with Dr Shirlee Latch. (July 2013) You will receive a reminder letter in the mail two months in advance. If you don't receive a letter, please call our office to schedule the follow-up appointment.

## 2011-11-21 NOTE — Progress Notes (Signed)
PCP: Dr. Loney Hering in [place]  61 yo with history of HTN and hyperlipidemia presents for cardiology followup.  In 8/12, patient developed severe substernal chest pain.  He went to the ER at [facility].  Cardiac enzymes were positive, so he was sent to [facility].  Left heart cath showed EF 55% and plaque + significant mobile thrombus in the proximal LAD creating about 80% stenosis.  He was treated with ticagrelor and heparin gtt.  Re-look cath several days later showed only  20-25% proximal LAD stenosis and resolution of the thrombus.  Ticagrelor was continued.  Since getting home, he has been doing well.  No exertional chest pain or dyspnea.  He has been doing cardiac rehab at [facility].  He has some occasional mild orthostatic-type lightheadedness.  He says his BP at cardiac rehab is never < 100.    Labs (8/12): K 3.7, creatinine 0.81, LDL 125, HDL 44 Labs (11/12): creatinine 0.98, LDL 41, HDL 40  PMH: 1. Hyperlipidemia 2. HTN 3. Esophageal stricture 4. Colonic polyps 5. Depression 6. CAD: NSTEMI 8/12.  LHC with plaque and mobile thrombus with up to 80% stenosis in proximal LAD.  Patient was treated with ticagrelor and heparin.  Relook cath was done several days later.  This showed resolution of the mobile thrombus.  There was only 20-25% proximal LAD stenosis.  EF as 55% on LV-gram.  7. Raynaud's phenomenon  SH: Lives in [place], [place].  Married.  Never smoked.  Retired Chartered loss adjuster.    FH: Father died of MI at 79, had his 1st MI at 106.    Current Outpatient Prescriptions  Medication Sig Dispense Refill  . aspirin EC 81 MG tablet Take 1 tablet (81 mg total) by mouth daily.      Marland Kitchen atorvastatin (LIPITOR) 80 MG tablet Take 1 tablet by mouth Daily.      Marland Kitchen BRILINTA 90 MG TABS tablet Take 1 tablet by mouth Twice daily.      Marland Kitchen COLCRYS 0.6 MG tablet as needed.       Marland Kitchen FLUoxetine (PROZAC) 20 MG capsule Take 20 mg by mouth daily.       Marland Kitchen NITROSTAT 0.4 MG SL tablet as needed.      Marland Kitchen omeprazole (PRILOSEC) 20  MG capsule 1 po 30 minute prior to your first meal  30 capsule  5  . Polyethylene Glycol 3350 GRAN Take 17 g by mouth daily as needed.  527 g  5  . ramipril (ALTACE) 2.5 MG capsule Take 1 capsule (2.5 mg total) by mouth daily.  30 capsule  6    BP 105/77  Pulse 50  Ht 5\' 9"  (1.753 m)  Wt 72.122 kg (159 lb)  BMI 23.48 kg/m2 General: NAD Neck: No JVD, no thyromegaly or thyroid nodule.  Lungs: Clear to auscultation bilaterally with normal respiratory effort. CV: Nondisplaced PMI.  Heart regular S1/S2, no S3/S4, no murmur.  No peripheral edema.  No carotid bruit.  Normal pedal pulses.  Abdomen: Soft, nontender, no hepatosplenomegaly, no distention.  Neurologic: Alert and oriented x 3.  Psych: Normal affect. Extremities: No clubbing or cyanosis.

## 2011-11-21 NOTE — Assessment & Plan Note (Signed)
NSTEMI with mobile thrombus superimposed on plaque in the proximal LAD in 8/12.  After treatment with ticagrelor and heparin gtt, the thrombus resolved, leaving about 25% proximal LAD stenosis by plaque.  Continue ticagrelor for 1 year (until 9/13).  He will also continue on ASA 81 and statin.  He is bradycardic (HR around 50) so I have not started a beta blocker. He has tolerated ramipril.  Continue cardiac rehab.

## 2012-02-23 ENCOUNTER — Other Ambulatory Visit: Payer: Self-pay | Admitting: Cardiology

## 2012-05-20 ENCOUNTER — Ambulatory Visit (INDEPENDENT_AMBULATORY_CARE_PROVIDER_SITE_OTHER): Payer: BC Managed Care – PPO | Admitting: Cardiology

## 2012-05-20 ENCOUNTER — Encounter: Payer: Self-pay | Admitting: Cardiology

## 2012-05-20 VITALS — BP 133/85 | HR 50 | Wt 158.0 lb

## 2012-05-20 DIAGNOSIS — I2510 Atherosclerotic heart disease of native coronary artery without angina pectoris: Secondary | ICD-10-CM

## 2012-05-20 DIAGNOSIS — E785 Hyperlipidemia, unspecified: Secondary | ICD-10-CM

## 2012-05-20 LAB — BASIC METABOLIC PANEL
BUN: 20 mg/dL (ref 6–23)
CO2: 28 mEq/L (ref 19–32)
Calcium: 9.4 mg/dL (ref 8.4–10.5)
Chloride: 105 mEq/L (ref 96–112)
Creatinine, Ser: 1 mg/dL (ref 0.4–1.5)
GFR: 81.02 mL/min (ref >60.00)
Glucose, Bld: 93 mg/dL (ref 70–99)
Potassium: 4.2 mEq/L (ref 3.5–5.1)
Sodium: 141 mEq/L (ref 135–145)

## 2012-05-20 LAB — LIPID PANEL
Cholesterol: 141 mg/dL (ref 0–200)
HDL: 42.2 mg/dL (ref >39.00)
LDL Cholesterol: 65 mg/dL (ref 0–99)
Total CHOL/HDL Ratio: 3
Triglycerides: 168 mg/dL — ABNORMAL HIGH (ref 0.0–149.0)
VLDL: 33.6 mg/dL (ref 0.0–40.0)

## 2012-05-20 NOTE — Progress Notes (Signed)
Patient ID: Terry Powell, male   DOB: [birth year], 61 y.o.   MRN: [id number] PCP: Dr. Loney Hering in [place]  61 yo with history of HTN and hyperlipidemia presents for cardiology followup.  In 8/12, patient developed severe substernal chest pain.  He went to the ER at [facility].  Cardiac enzymes were positive, so he was sent to [facility].  Left heart cath showed EF 55% and plaque + significant mobile thrombus in the proximal LAD creating about 80% stenosis.  He was treated with ticagrelor and heparin gtt.  Re-look cath several days later showed only  20-25% proximal LAD stenosis and resolution of the thrombus.  Ticagrelor was continued.  Since getting home, he has been doing well.  No exertional chest pain or dyspnea.  He completed cardiac rehab at [facility].  Main complaint has been joint pains (no myalgias).      Labs (8/12): K 3.7, creatinine 0.81, LDL 125, HDL 44 Labs (11/12): creatinine 0.98, LDL 41, HDL 40  ECG: NSR, normal  PMH: 1. Hyperlipidemia 2. HTN 3. Esophageal stricture 4. Colonic polyps 5. Depression 6. CAD: NSTEMI 8/12.  LHC with plaque and mobile thrombus with up to 80% stenosis in proximal LAD.  Patient was treated with ticagrelor and heparin.  Relook cath was done several days later.  This showed resolution of the mobile thrombus.  There was only 20-25% proximal LAD stenosis.  EF as 55% on LV-gram.  7. Raynaud's phenomenon  SH: Lives in [place], [place].  Married.  Never smoked.  Retired Chartered loss adjuster.    FH: Father died of MI at 52, had his 1st MI at 48.    Current Outpatient Prescriptions  Medication Sig Dispense Refill  . aspirin EC 81 MG tablet Take 1 tablet (81 mg total) by mouth daily.      Marland Kitchen atorvastatin (LIPITOR) 80 MG tablet Take 1 tablet by mouth Daily.      Marland Kitchen BRILINTA 90 MG TABS tablet Take 1 tablet by mouth Twice daily.      Marland Kitchen COLCRYS 0.6 MG tablet as needed.       Marland Kitchen FLUoxetine (PROZAC) 20 MG capsule Take 20 mg by mouth daily.       Marland Kitchen NITROSTAT 0.4 MG SL tablet Place 0.4  mg under the tongue every 5 (five) minutes as needed.       . Polyethylene Glycol 3350 GRAN Take 17 g by mouth daily as needed.  527 g  5  . ramipril (ALTACE) 2.5 MG capsule TAKE ONE CAPSULE EVERY DAY  30 capsule  6    BP 133/85  Pulse 50  Wt 158 lb (71.668 kg) General: NAD Neck: No JVD, no thyromegaly or thyroid nodule.  Lungs: Clear to auscultation bilaterally with normal respiratory effort. CV: Nondisplaced PMI.  Heart regular S1/S2, no S3/S4, no murmur.  No peripheral edema.  No carotid bruit.  Normal pedal pulses.  Abdomen: Soft, nontender, no hepatosplenomegaly, no distention.  Neurologic: Alert and oriented x 3.  Psych: Normal affect. Extremities: No clubbing or cyanosis.

## 2012-05-20 NOTE — Assessment & Plan Note (Signed)
NSTEMI with mobile thrombus superimposed on plaque in the proximal LAD in 8/12.  After treatment with ticagrelor and heparin gtt, the thrombus resolved, leaving about 25% proximal LAD stenosis by plaque.  Continue ticagrelor for 1 year (until 9/13).  He will also continue on ASA 81 and statin.  He is bradycardic (HR around 50) so I have not started a beta blocker. He has tolerated ramipril.

## 2012-05-20 NOTE — Assessment & Plan Note (Signed)
Will check lipids today with goal LDL < 70.

## 2012-05-20 NOTE — Patient Instructions (Addendum)
Your physician recommends that you have a FASTING lipid profile /BMET today.  You can stop Brilinta (Ticagrelor)September 1 ,2013.  Your physician wants you to follow-up in: 1 year with Dr Shirlee Latch. (July 2014). You will receive a reminder letter in the mail two months in advance. If you don't receive a letter, please call our office to schedule the follow-up appointment.

## 2012-07-06 ENCOUNTER — Other Ambulatory Visit: Payer: Self-pay | Admitting: Physician Assistant

## 2012-09-18 ENCOUNTER — Other Ambulatory Visit: Payer: Self-pay | Admitting: Cardiology

## 2012-09-19 NOTE — Telephone Encounter (Signed)
Fax Received. Refill Completed. Rufus Cypert Chowoe (R.M.A)   

## 2013-04-14 ENCOUNTER — Other Ambulatory Visit: Payer: Self-pay | Admitting: Cardiology

## 2013-04-14 NOTE — Telephone Encounter (Signed)
..   Requested Prescriptions   Pending Prescriptions Disp Refills  . ramipril (ALTACE) 2.5 MG capsule [Pharmacy Med Name: RAMIPRIL 2.5 MG CAPSULE] 30 capsule 3    Sig: TAKE ONE CAPSULE EVERY DAY  .Marland KitchenPatient needs to contact office to schedule  Appointment  for future refills.[phone]. Thank you.

## 2013-05-06 ENCOUNTER — Other Ambulatory Visit: Payer: Self-pay | Admitting: Cardiology

## 2013-05-22 ENCOUNTER — Other Ambulatory Visit: Payer: Self-pay | Admitting: Cardiology

## 2013-06-06 ENCOUNTER — Encounter: Payer: Self-pay | Admitting: *Deleted

## 2013-06-11 ENCOUNTER — Encounter: Payer: Self-pay | Admitting: Cardiology

## 2013-06-11 ENCOUNTER — Ambulatory Visit (INDEPENDENT_AMBULATORY_CARE_PROVIDER_SITE_OTHER): Payer: BC Managed Care – PPO | Admitting: Cardiology

## 2013-06-11 VITALS — BP 118/80 | HR 46 | Ht 69.0 in | Wt 158.0 lb

## 2013-06-11 DIAGNOSIS — E785 Hyperlipidemia, unspecified: Secondary | ICD-10-CM

## 2013-06-11 DIAGNOSIS — I2510 Atherosclerotic heart disease of native coronary artery without angina pectoris: Secondary | ICD-10-CM

## 2013-06-11 DIAGNOSIS — I498 Other specified cardiac arrhythmias: Secondary | ICD-10-CM

## 2013-06-11 DIAGNOSIS — R001 Bradycardia, unspecified: Secondary | ICD-10-CM

## 2013-06-11 LAB — BASIC METABOLIC PANEL
BUN: 16 mg/dL (ref 6–23)
Calcium: 9.1 mg/dL (ref 8.4–10.5)
Creatinine, Ser: 1 mg/dL (ref 0.4–1.5)

## 2013-06-11 LAB — LIPID PANEL
Cholesterol: 144 mg/dL (ref 0–200)
HDL: 47.2 mg/dL (ref >39.00)
LDL Cholesterol: 76 mg/dL (ref 0–99)
Total CHOL/HDL Ratio: 3
Triglycerides: 102 mg/dL (ref 0.0–149.0)

## 2013-06-11 NOTE — Patient Instructions (Addendum)
Your physician recommends that you have a FASTING lipid profile /BMET today.   Your physician wants you to follow-up in: 1 year with Dr Shirlee Latch. (August 2015).  You will receive a reminder letter in the mail two months in advance. If you don't receive a letter, please call our office to schedule the follow-up appointment.

## 2013-06-11 NOTE — Progress Notes (Signed)
Patient ID: Terry Powell, male   DOB: Nov 02, 1951, 62 y.o.   MRN: [id number] PCP: Dr. Loney Hering in [place]  62 yo with history of HTN and hyperlipidemia presents for cardiology followup.  In 8/12, patient developed severe substernal chest pain.  He went to the ER at [facility].  Cardiac enzymes were positive, so he was sent to [facility].  Left heart cath showed EF 55% and plaque + significant mobile thrombus in the proximal LAD creating about 80% stenosis.  He was treated with ticagrelor and heparin gtt.  Re-look cath several days later showed only  20-25% proximal LAD stenosis and resolution of the thrombus.  Ticagrelor was continued for a year then stopped.  He has done well since I last saw him.  No exertional chest pain or dyspnea.  He is going to the [facility] in [place] daily.  HR in upper 40s at rest but he says it gets up to the 130s with exercise.   Labs (8/12): K 3.7, creatinine 0.81, LDL 125, HDL 44 Labs (11/12): creatinine 0.98, LDL 41, HDL 40 Labs (7/13): K 4.2, creatinine 1.0, LDL 65, HDL 42  ECG: NSR at 47, borderline LVH  PMH: 1. Hyperlipidemia 2. HTN 3. Esophageal stricture 4. Colonic polyps 5. Depression 6. CAD: NSTEMI 8/12.  LHC with plaque and mobile thrombus with up to 80% stenosis in proximal LAD.  Patient was treated with ticagrelor and heparin.  Relook cath was done several days later.  This showed resolution of the mobile thrombus.  There was only 20-25% proximal LAD stenosis.  EF as 55% on LV-gram.  7. Raynaud's phenomenon  SH: Lives in [place], [place].  Married.  Never smoked.  Retired Chartered loss adjuster.    FH: Father died of MI at 61, had his 1st MI at 16.    Current Outpatient Prescriptions  Medication Sig Dispense Refill  . aspirin EC 81 MG tablet Take 1 tablet (81 mg total) by mouth daily.      Marland Kitchen atorvastatin (LIPITOR) 80 MG tablet TAKE 1 TABLET BY MOUTH EVERY DAY IN THE EVENING  30 tablet  0  .        . COLCRYS 0.6 MG tablet as needed.       Marland Kitchen FLUoxetine (PROZAC) 20 MG  capsule Take 20 mg by mouth daily.       Marland Kitchen NITROSTAT 0.4 MG SL tablet Place 0.4 mg under the tongue every 5 (five) minutes as needed.       . Polyethylene Glycol 3350 GRAN Take 17 g by mouth daily as needed.  527 g  5  . ramipril (ALTACE) 2.5 MG capsule TAKE ONE CAPSULE EVERY DAY  30 capsule  3  . VITAMIN D, CHOLECALCIFEROL, PO Take 2,000 Units by mouth daily.       No current facility-administered medications for this visit.    BP 118/80  Pulse 46  Ht 5\' 9"  (1.753 m)  Wt 71.668 kg (158 lb)  BMI 23.32 kg/m2 General: NAD Neck: No JVD, no thyromegaly or thyroid nodule.  Lungs: Clear to auscultation bilaterally with normal respiratory effort. CV: Nondisplaced PMI.  Heart regular S1/S2, no S3/S4, no murmur.  No peripheral edema.  No carotid bruit.  Normal pedal pulses.  Abdomen: Soft, nontender, no hepatosplenomegaly, no distention.  Neurologic: Alert and oriented x 3.  Psych: Normal affect. Extremities: No clubbing or cyanosis.   Assessment/Plan:  1. CAD: Stable, no ischemic symptoms.  Continue ASA 81, statin, ACEI.  No beta blocker due to bradycardia.  2. Bradycardia: Asymptomatic, good chronotropic response to exercise.   3. Hyperlipidemia: Will check lipids today, continue statin.   Marca Ancona 06/11/2013 12:38 PM

## 2013-06-12 ENCOUNTER — Telehealth: Payer: Self-pay | Admitting: Cardiology

## 2013-06-12 NOTE — Telephone Encounter (Signed)
Follow up  Pt is calling regarding his lab results.

## 2013-06-12 NOTE — Telephone Encounter (Signed)
Pt aware of lab results 

## 2013-07-14 ENCOUNTER — Other Ambulatory Visit: Payer: Self-pay | Admitting: Cardiology

## 2013-08-16 ENCOUNTER — Other Ambulatory Visit: Payer: Self-pay | Admitting: Cardiology

## 2014-03-12 ENCOUNTER — Other Ambulatory Visit: Payer: Self-pay | Admitting: Cardiology

## 2014-07-07 ENCOUNTER — Other Ambulatory Visit: Payer: Self-pay | Admitting: Cardiology

## 2014-11-08 ENCOUNTER — Other Ambulatory Visit: Payer: Self-pay | Admitting: Cardiology

## 2015-01-21 ENCOUNTER — Encounter: Payer: Self-pay | Admitting: Cardiology

## 2015-01-21 ENCOUNTER — Encounter: Payer: Self-pay | Admitting: *Deleted

## 2015-01-21 ENCOUNTER — Ambulatory Visit (INDEPENDENT_AMBULATORY_CARE_PROVIDER_SITE_OTHER): Payer: BLUE CROSS/BLUE SHIELD | Admitting: Cardiology

## 2015-01-21 VITALS — BP 124/80 | HR 51 | Ht 69.0 in | Wt 167.0 lb

## 2015-01-21 DIAGNOSIS — R0602 Shortness of breath: Secondary | ICD-10-CM

## 2015-01-21 DIAGNOSIS — Z87898 Personal history of other specified conditions: Secondary | ICD-10-CM | POA: Insufficient documentation

## 2015-01-21 DIAGNOSIS — E785 Hyperlipidemia, unspecified: Secondary | ICD-10-CM

## 2015-01-21 DIAGNOSIS — I2510 Atherosclerotic heart disease of native coronary artery without angina pectoris: Secondary | ICD-10-CM

## 2015-01-21 LAB — BASIC METABOLIC PANEL
BUN: 18 mg/dL (ref 6–23)
CO2: 28 mEq/L (ref 19–32)
CREATININE: 1.03 mg/dL (ref 0.40–1.50)
Calcium: 9.5 mg/dL (ref 8.4–10.5)
Chloride: 103 mEq/L (ref 96–112)
GFR: 77.62 mL/min (ref >60.00)
Glucose, Bld: 94 mg/dL (ref 70–99)
Potassium: 3.7 mEq/L (ref 3.5–5.1)
SODIUM: 139 meq/L (ref 135–145)

## 2015-01-21 LAB — CBC WITH DIFFERENTIAL/PLATELET
BASOS ABS: 0 10*3/uL (ref 0.0–0.1)
Basophils Relative: 0.5 % (ref 0.0–3.0)
EOS ABS: 0.2 10*3/uL (ref 0.0–0.7)
Eosinophils Relative: 3.2 % (ref 0.0–5.0)
HCT: 51.6 % (ref 39.0–52.0)
Hemoglobin: 17.7 g/dL — ABNORMAL HIGH (ref 13.0–17.0)
LYMPHS PCT: 29.6 % (ref 12.0–46.0)
Lymphs Abs: 2.1 10*3/uL (ref 0.7–4.0)
MCHC: 34.3 g/dL (ref 30.0–36.0)
MCV: 86.8 fl (ref 78.0–100.0)
Monocytes Absolute: 0.7 10*3/uL (ref 0.1–1.0)
Monocytes Relative: 9.5 % (ref 3.0–12.0)
NEUTROS PCT: 57.2 % (ref 43.0–77.0)
Neutro Abs: 4.1 10*3/uL (ref 1.4–7.7)
Platelets: 203 10*3/uL (ref 150.0–400.0)
RBC: 5.95 Mil/uL — AB (ref 4.22–5.81)
RDW: 13.5 % (ref 11.5–15.5)
WBC: 7.1 10*3/uL (ref 4.0–10.5)

## 2015-01-21 LAB — TSH: TSH: 1.97 u[IU]/mL (ref 0.35–4.50)

## 2015-01-21 MED ORDER — LOSARTAN POTASSIUM 50 MG PO TABS: 50.0000 mg | ORAL_TABLET | Freq: Every day | ORAL | Status: DC

## 2015-01-21 NOTE — Progress Notes (Signed)
Patient ID: Terry Powell, male   DOB: 02/12/1951, 64 y.o.   MRN: [id number] PCP: Dr. Wenda Overland in [place]  64 yo with history of HTN and hyperlipidemia presents for cardiology followup.  In 8/12, patient developed severe substernal chest pain.  He went to the ER at [facility].  Cardiac enzymes were positive, so he was sent to [facility].  Left heart cath showed EF 55% and plaque + significant mobile thrombus in the proximal LAD creating about 80% stenosis.  He was treated with ticagrelor and heparin gtt.  Re-look cath several days later showed only  20-25% proximal LAD stenosis and resolution of the thrombus.  Ticagrelor was continued for a year then stopped.    He has been having episodes of left shoulder pain recently.  The pain radiates to the left hand.  It is not associated with use of the arm.  It will occur every few days.  It does not seem to be related to exertion.  No particular trigger.  No chest pain.  He additionally has had a chronic cough x 6 months.  He has never smoked.  He is short of breath walking up a flight of steps but does ok on flat ground.  Weight is up about 9 lbs.  He has not been exercising as much.   Labs (8/12): K 3.7, creatinine 0.81, LDL 125, HDL 44 Labs (11/12): creatinine 0.98, LDL 41, HDL 40 Labs (7/13): K 4.2, creatinine 1.0, LDL 65, HDL 42 Labs (8/14): LDL 76, HDL 47  ECG: NSR at 52, LVH  PMH: 1. Hyperlipidemia 2. HTN 3. Esophageal stricture 4. Colonic polyps 5. Depression 6. CAD: NSTEMI 8/12.  LHC with plaque and mobile thrombus with up to 80% stenosis in proximal LAD.  Patient was treated with ticagrelor and heparin.  Relook cath was done several days later.  This showed resolution of the mobile thrombus.  There was only 20-25% proximal LAD stenosis.  EF as 55% on LV-gram.  7. Raynaud's phenomenon  SH: Lives in [place], [place].  Married.  Never smoked.  Retired Emergency planning/management officer.    FH: Father died of MI at 91, had his 1st MI at 44.   ROS: All systems reviewed and  negative except as per HPI.    Current Outpatient Prescriptions  Medication Sig Dispense Refill  . aspirin EC 81 MG tablet Take 1 tablet (81 mg total) by mouth daily.    Marland Kitchen atorvastatin (LIPITOR) 80 MG tablet TAKE 1 TABLET BY MOUTH EVERY EVENING 30 tablet 3  . BRILINTA 90 MG TABS tablet Take 1 tablet by mouth Twice daily.    Marland Kitchen COLCRYS 0.6 MG tablet as needed.     Marland Kitchen FLUoxetine (PROZAC) 20 MG capsule Take 20 mg by mouth daily.     Marland Kitchen NITROSTAT 0.4 MG SL tablet Place 0.4 mg under the tongue every 5 (five) minutes as needed.     . Polyethylene Glycol 3350 GRAN Take 17 g by mouth daily as needed. 527 g 5  . VITAMIN D, CHOLECALCIFEROL, PO Take 2,000 Units by mouth daily.    Marland Kitchen losartan (COZAAR) 50 MG tablet Take 1 tablet (50 mg total) by mouth daily. 90 tablet 3   No current facility-administered medications for this visit.     BP 124/80 mmHg  Pulse 51  Ht 5\' 9"  (1.753 m)  Wt 167 lb (75.751 kg)  BMI 24.65 kg/m2 General: NAD Neck: No JVD, no thyromegaly or thyroid nodule.  Lungs: Clear to auscultation bilaterally with normal respiratory  effort. CV: Nondisplaced PMI.  Heart regular S1/S2, no S3/S4, no murmur.  No peripheral edema.  No carotid bruit.  Normal pedal pulses.  Abdomen: Soft, nontender, no hepatosplenomegaly, no distention.  Neurologic: Alert and oriented x 3.  Psych: Normal affect. Extremities: No clubbing or cyanosis.  MSK: No tenderness with palpation around the left shoulder.   Assessment/Plan:  1. CAD: He has been having episodes of left should pain radiating down to the left arm.  No definite trigger.  - - Continue ASA 81, statin.  No beta blocker due to bradycardia.  - Given episodes of left shoulder pain radiating to hand, I will arrange for ETT-Cardiolite for risk stratification.  2. Bradycardia: Asymptomatic, good chronotropic response to exercise.   3. Hyperlipidemia: Will check lipids today, continue statin.  4. Cough: Chronic, may be due to ACEI.  Stop ramipril,  start losartan 50 mg daily. BMET in 10 days.   Loralie Champagne 01/21/2015

## 2015-01-21 NOTE — Patient Instructions (Signed)
Stop ramipril.  Take losartan 50mg  daily.   Your physician recommends that you have lab work today --BMET/TSH/CBCd  Your physician has requested that you have en exercise stress myoview. For further information please visit [email or web address]. Please follow instruction sheet, as given.  Your physician recommends that you return for lab work in: about 1 week--BMET.  Your physician wants you to follow-up in: 1 year with Dr Aundra Dubin. (March 2017). You will receive a reminder letter in the mail two months in advance. If you don't receive a letter, please call our office to schedule the follow-up appointment.

## 2015-01-28 ENCOUNTER — Telehealth: Payer: Self-pay | Admitting: *Deleted

## 2015-01-28 NOTE — Telephone Encounter (Signed)
Patient called in to confirm that he was supposed to STOP Ramipril and START Losartan. Verified orders from his OV on 01/21/23 and discussed dose/frequency. Patient stated he just picked up his new medicine today so he will come get blood work done next week after he has been on it for a week. He will STOP the Ramipril as of today.

## 2015-02-01 ENCOUNTER — Encounter (HOSPITAL_COMMUNITY): Payer: BLUE CROSS/BLUE SHIELD

## 2015-02-01 ENCOUNTER — Other Ambulatory Visit: Payer: BLUE CROSS/BLUE SHIELD

## 2015-02-11 ENCOUNTER — Other Ambulatory Visit: Payer: BLUE CROSS/BLUE SHIELD

## 2015-02-16 ENCOUNTER — Ambulatory Visit (HOSPITAL_COMMUNITY): Payer: BLUE CROSS/BLUE SHIELD | Attending: Cardiology | Admitting: Radiology

## 2015-02-16 ENCOUNTER — Other Ambulatory Visit (INDEPENDENT_AMBULATORY_CARE_PROVIDER_SITE_OTHER): Payer: BLUE CROSS/BLUE SHIELD

## 2015-02-16 DIAGNOSIS — M79642 Pain in left hand: Secondary | ICD-10-CM | POA: Insufficient documentation

## 2015-02-16 DIAGNOSIS — M25512 Pain in left shoulder: Secondary | ICD-10-CM | POA: Diagnosis not present

## 2015-02-16 DIAGNOSIS — R0602 Shortness of breath: Secondary | ICD-10-CM | POA: Diagnosis not present

## 2015-02-16 LAB — BASIC METABOLIC PANEL
BUN: 15 mg/dL (ref 6–23)
CHLORIDE: 105 meq/L (ref 96–112)
CO2: 29 meq/L (ref 19–32)
CREATININE: 1 mg/dL (ref 0.40–1.50)
Calcium: 9.4 mg/dL (ref 8.4–10.5)
GFR: 80.29 mL/min (ref >60.00)
Glucose, Bld: 113 mg/dL — ABNORMAL HIGH (ref 70–99)
Potassium: 5 mEq/L (ref 3.5–5.1)
Sodium: 139 mEq/L (ref 135–145)

## 2015-02-16 MED ORDER — TECHNETIUM TC 99M SESTAMIBI GENERIC - CARDIOLITE
11.0000 | Freq: Once | INTRAVENOUS | Status: AC | PRN
  Administered 2015-02-16: 11 via INTRAVENOUS

## 2015-02-16 MED ORDER — TECHNETIUM TC 99M SESTAMIBI GENERIC - CARDIOLITE
33.0000 | Freq: Once | INTRAVENOUS | Status: AC | PRN
  Administered 2015-02-16: 33 via INTRAVENOUS

## 2015-02-16 NOTE — Progress Notes (Addendum)
[facility] [address] [place], [place] [address] [phone]    Cardiology Nuclear Med Study  Terry Powell is a 64 y.o. male     MRN : [id number]     DOB: [birth year]  Procedure Date: 02/16/2015  Nuclear Med Background Indication for Stress Test:  Evaluation for Ischemia and Follow-up CAD History:  CAD, MPI ~10 yrs. ago Cardiac Risk Factors: Hypertension  Symptoms:  DOE and left shoulder pain radiating to his hand   Nuclear Pre-Procedure Caffeine/Decaff Intake:  None> 12 hrs NPO After: 7:00pm   Lungs:  clear O2 Sat: 96% on room air. IV 0.9% NS with Angio Cath:  22g  IV Site: R Antecubital x 1, tolerated well IV Started by:  Irven Baltimore, RN  Chest Size (in):  42 Cup Size: n/a  Height: 5\' 9"  (1.753 m)  Weight:  172 lb (78.019 kg)  BMI:  Body mass index is 25.39 kg/(m^2). Tech Comments:  Patient took Cozaar last night. Irven Baltimore, RN.    Nuclear Med Study 1 or 2 day study: 1 day  Stress Test Type:  Stress  Reading MD: N/A  Order Authorizing Provider:  Loralie Champagne, MD  Resting Radionuclide: Technetium 48m Sestamibi  Resting Radionuclide Dose: 11.0 mCi   Stress Radionuclide:  Technetium 1m Sestamibi  Stress Radionuclide Dose: 33.0 mCi           Stress Protocol Rest HR: 48 Stress HR: 146  Rest BP: 129/83 Stress BP: 167/90  Exercise Time (min): 10:30 METS: 12.5   Predicted Max HR: 158 bpm % Max HR: 92.41 bpm Rate Pressure Product: 24401   Dose of Adenosine (mg):  n/a Dose of Lexiscan: n/a mg  Dose of Atropine (mg): n/a Dose of Dobutamine: n/a mcg/kg/min (at max HR)  Stress Test Technologist: Glade Lloyd, BS-ES  Nuclear Technologist:  Earl Many, CNMT     Rest Procedure:  Myocardial perfusion imaging was performed at rest 45 minutes following the intravenous administration of Technetium 26m Sestamibi. Rest ECG: Sinus bradycardia; LVH; no ST changes.  Stress Procedure:  The patient exercised on the treadmill utilizing the  Bruce Protocol for 10:30 minutes. The patient stopped due to fatigue and denied any chest pain.  Technetium 45m Sestamibi was injected at peak exercise and myocardial perfusion imaging was performed after a brief delay. Stress ECG: No significant ST segment change suggestive of ischemia.  QPS Raw Data Images:  Acquisition technically good; normal left ventricular size. Stress Images:  There is decreased uptake in the inferior wall. Rest Images:  There is decreased uptake in the inferior wall, slightly less prominent compared to the stress images. Subtraction (SDS):  These findings are consistent with inferior thinning and possible minimal inferior ischemia. Transient Ischemic Dilatation (Normal <1.22):  0.91 Lung/Heart Ratio (Normal <0.45):  0.36  Quantitative Gated Spect Images QGS EDV:  91 ml QGS ESV:  42 ml  Impression Exercise Capacity:  Good exercise capacity. BP Response:  Normal blood pressure response. Clinical Symptoms:  No chest pain or dyspnea. ECG Impression:  No significant ST segment change suggestive of ischemia. Comparison with Prior Nuclear Study: No previous nuclear study performed  Overall Impression:  Low risk stress nuclear study with a small, mild, partially reversible inferior defect consistent with inferior thinning and possible minimal inferior ischemia.  LV Ejection Fraction: 54%.  LV Wall Motion:  NL LV Function; NL Wall Motion  Terry Powell  Low risk study with inferior thinning, probably not significant ischemia.  Would  watch for now unless symptoms worsen, then call in and would consider cath.   Terry Powell 02/17/2015

## 2015-02-17 NOTE — Progress Notes (Signed)
lmtcb

## 2015-02-19 ENCOUNTER — Telehealth: Payer: Self-pay

## 2015-02-19 NOTE — Telephone Encounter (Signed)
Patient called back about his stress test. Per Dr. Aundra Dubin, low risk study with inferior thinning, probably not significant ischemia. Would watch for now unless symptoms worsen, then call in and would consider cath. Patient states his symptoms have not gotten worse. Encouraged patient to call office if his symptoms do worsen and we can set him up for a heart cath. Patient verbalized understanding.

## 2015-02-22 NOTE — Progress Notes (Signed)
See phone note 02/19/15

## 2015-02-23 ENCOUNTER — Telehealth: Payer: Self-pay | Admitting: Cardiology

## 2015-02-23 NOTE — Telephone Encounter (Signed)
New message ° ° ° ° °Want stress test results °

## 2015-02-23 NOTE — Telephone Encounter (Signed)
Pt given results of recent myoview

## 2015-03-07 ENCOUNTER — Other Ambulatory Visit: Payer: Self-pay | Admitting: Cardiology

## 2016-01-23 ENCOUNTER — Other Ambulatory Visit: Payer: Self-pay | Admitting: Cardiology

## 2016-02-08 ENCOUNTER — Other Ambulatory Visit: Payer: Self-pay | Admitting: Cardiology

## 2016-03-09 ENCOUNTER — Other Ambulatory Visit: Payer: Self-pay | Admitting: Cardiology

## 2016-03-30 IMAGING — NM NM MISC PROCEDURE
6 series · 36 of 36 positions shown · non-contrast
Comparison: none

[Series 1: stress-gsp · 6.40mm/px · 6 of 512 frames shown]
[frame 43/512]
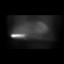
[frame 128/512]
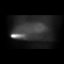
[frame 214/512]
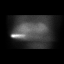
[frame 299/512]
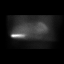
[frame 384/512]
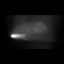
[frame 470/512]
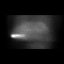

[Series 1: wbr_s-card_st stress-gsp · 6.4mm · 6.40mm/px · 6 of 175 frames shown]
[frame 15/175]
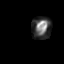
[frame 44/175]
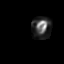
[frame 73/175]
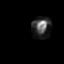
[frame 102/175]
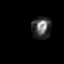
[frame 131/175]
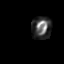
[frame 161/175]
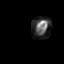

[Series 1: rest · 6.40mm/px · 6 of 64 frames shown]
[frame 6/64]
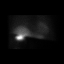
[frame 16/64]
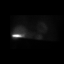
[frame 27/64]
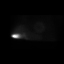
[frame 38/64]
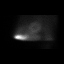
[frame 48/64]
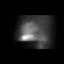
[frame 59/64]
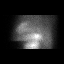

[Series 1: stress-sum-em · 6.40mm/px · 6 of 64 frames shown]
[frame 6/64]
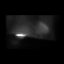
[frame 16/64]
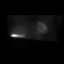
[frame 27/64]
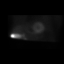
[frame 38/64]
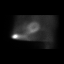
[frame 48/64]
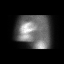
[frame 59/64]
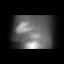

[Series 1: wbr_r-card_st rest · 6.4mm · 6.40mm/px · 6 of 21 frames shown]
[frame 2/21]
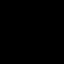
[frame 6/21]
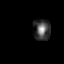
[frame 9/21]
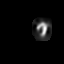
[frame 13/21]
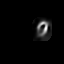
[frame 16/21]
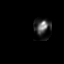
[frame 20/21]
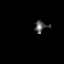

[Series 1: wbr_s-card_st stress-sum-em · 6.4mm · 6.40mm/px · 6 of 22 frames shown]
[frame 2/22]
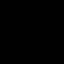
[frame 6/22]
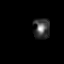
[frame 10/22]
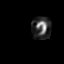
[frame 13/22]
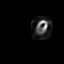
[frame 17/22]
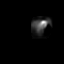
[frame 21/22]
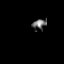

[36 of 36 positions shown; findings below may reference images not displayed]

Canned report from images found in remote index.

Refer to host system for actual result text.

## 2016-04-24 ENCOUNTER — Other Ambulatory Visit: Payer: Self-pay | Admitting: Cardiology

## 2016-05-10 ENCOUNTER — Encounter: Payer: Self-pay | Admitting: Gastroenterology

## 2016-05-26 ENCOUNTER — Other Ambulatory Visit: Payer: Self-pay | Admitting: *Deleted

## 2016-05-26 MED ORDER — LOSARTAN POTASSIUM 50 MG PO TABS
50.0000 mg | ORAL_TABLET | Freq: Every day | ORAL | Status: DC
Start: 2016-05-26 — End: 2016-06-15

## 2016-06-15 ENCOUNTER — Encounter: Payer: Self-pay | Admitting: Cardiology

## 2016-06-15 ENCOUNTER — Telehealth: Payer: Self-pay | Admitting: *Deleted

## 2016-06-15 ENCOUNTER — Ambulatory Visit (INDEPENDENT_AMBULATORY_CARE_PROVIDER_SITE_OTHER): Payer: BLUE CROSS/BLUE SHIELD | Admitting: Cardiology

## 2016-06-15 VITALS — BP 120/78 | HR 62 | Ht 69.0 in | Wt 173.0 lb

## 2016-06-15 DIAGNOSIS — I2510 Atherosclerotic heart disease of native coronary artery without angina pectoris: Secondary | ICD-10-CM

## 2016-06-15 DIAGNOSIS — I10 Essential (primary) hypertension: Secondary | ICD-10-CM

## 2016-06-15 DIAGNOSIS — E785 Hyperlipidemia, unspecified: Secondary | ICD-10-CM

## 2016-06-15 MED ORDER — NITROGLYCERIN 0.4 MG SL SUBL: 0.4000 mg | SUBLINGUAL_TABLET | SUBLINGUAL | 2 refills | Status: DC | PRN

## 2016-06-15 MED ORDER — LOSARTAN POTASSIUM 50 MG PO TABS: 50.0000 mg | ORAL_TABLET | Freq: Every day | ORAL | 10 refills | Status: DC

## 2016-06-15 MED ORDER — ATORVASTATIN CALCIUM 80 MG PO TABS: 80.0000 mg | ORAL_TABLET | Freq: Every evening | ORAL | 10 refills | Status: DC

## 2016-06-15 NOTE — Progress Notes (Signed)
Cardiology Office Note   Date:  06/15/2016   ID:  Amartya Aslinger, DOB 05/01/1951, MRN [id number]  PCP:  Celedonio Savage, MD  Cardiologist:  Dr. Aundra Dubin    Chief Complaint  Patient presents with  . Coronary Artery Disease    no chest pain      History of Present Illness: Terry Powell is a 65 y.o. male who presents for CAD and hyperlipidemia, HTN follow up.    He has a history of HTN and hyperlipidemia.  Last seen by Dr. Aundra Dubin 01/2015.  In 8/12, patient developed severe substernal chest pain.  He went to the ER at [facility].  Cardiac enzymes were positive, so he was sent to [facility].  Left heart cath showed EF 55% and plaque + significant mobile thrombus in the proximal LAD creating about 80% stenosis.  He was treated with ticagrelor and heparin gtt.  Re-look cath several days later showed only  20-25% proximal LAD stenosis and resolution of the thrombus.  Ticagrelor was continued for a year then stopped.    He has been having episodes of left shoulder pain last year that radiated to the left hand.  It is not associated with use of the arm.    He had a nuc test that was negative for ischemia.    He has not been exercising as much. Lately but he has 6 acres of land to [employer] on [address].  No chest pain has some Lt neck pain with movement.  He is thinking of selling his place and moving to [place].       Past Medical History:  Diagnosis Date  . Arthritis   . Depression    since back surgery  . Gout   . Hypercholesteremia   . Hypertension   . Solitary kidney, congenital     Past Surgical History:  Procedure Laterality Date  . ANKLE FRACTURE SURGERY    . BACK SURGERY    . COLONOSCOPY  06/08/2011   Procedure: COLONOSCOPY;  Surgeon: Dorothyann Peng, MD;  [facility];  Service: Endoscopy;  Laterality: N/A;  . ESOPHAGEAL DILATION  06/08/2011   Procedure: ESOPHAGEAL DILATION;  Surgeon: Dorothyann Peng, MD;  [facility];  Service: Endoscopy;;  .  ESOPHAGOGASTRODUODENOSCOPY  06/08/2011   Procedure: ESOPHAGOGASTRODUODENOSCOPY (EGD);  Surgeon: Dorothyann Peng, MD;  [facility];  Service: Endoscopy;  Laterality: N/A;     Current Outpatient Prescriptions  Medication Sig Dispense Refill  . aspirin EC 81 MG tablet Take 1 tablet (81 mg total) by mouth daily.    Marland Kitchen atorvastatin (LIPITOR) 80 MG tablet Take 1 tablet (80 mg total) by mouth every evening. 30 tablet 10  . COLCRYS 0.6 MG tablet Take 0.6 mg by mouth as needed (for gout).     Marland Kitchen FLUoxetine (PROZAC) 20 MG capsule Take 20 mg by mouth daily.     Marland Kitchen losartan (COZAAR) 50 MG tablet Take 1 tablet (50 mg total) by mouth daily. 30 tablet 10  . nitroGLYCERIN (NITROSTAT) 0.4 MG SL tablet Place 1 tablet (0.4 mg total) under the tongue every 5 (five) minutes as needed. 20 tablet 2  . Polyethylene Glycol 3350 GRAN Take 17 g by mouth daily as needed. 527 g 5  . VITAMIN D, CHOLECALCIFEROL, PO Take 2,000 Units by mouth daily.     No current facility-administered medications for this visit.     Allergies:   Contrast media [iodinated diagnostic agents]    Social History:  The patient  reports that he has never smoked. He does not have any smokeless tobacco history on file. He reports that he does not drink alcohol or use drugs.   Family History:  The patient's family history includes Colon cancer (age of onset: 64) in his maternal grandfather; Heart disease (age of onset: 43) in his father; Ulcers in his mother; Ulcers (age of onset: 16) in his father.    ROS:  General:no colds or fevers, + weight increase from last year Skin:no rashes or ulcers HEENT:no blurred vision, no congestion CV:see HPI PUL:see HPI GI:no diarrhea constipation or melena, no indigestion GU:no hematuria, no dysuria MS:no joint pain, no claudication Neuro:no syncope, no lightheadedness Endo:no diabetes, no thyroid disease  Wt Readings from Last 3 Encounters:  06/15/16 173 lb (78.5 kg)  02/16/15 172 lb (78 kg)    01/21/15 167 lb (75.8 kg)     PHYSICAL EXAM: VS:  BP 120/78   Pulse 62   Ht 5\' 9"  (1.753 m)   Wt 173 lb (78.5 kg)   BMI 25.55 kg/m  , BMI Body mass index is 25.55 kg/m. General:Pleasant affect, NAD Skin:Warm and dry, brisk capillary refill HEENT:normocephalic, sclera clear, mucus membranes moist Neck:supple, no JVD, no bruits  Heart:S1S2 RRR without murmur, gallup, rub or click Lungs:clear without rales, rhonchi, or wheezes [id number], non tender, + BS, do not palpate liver spleen or masses Ext:no lower ext edema, 2+ pedal pulses, 2+ radial pulses Neuro:alert and oriented, MAE, follows commands, + facial symmetry    EKG:  EKG is ordered today. The ekg ordered today demonstrates SR LVH, non specific ST abnormality.  No changes from last year.    Recent Labs: No results found for requested labs within last 8760 hours.    Lipid Panel    Component Value Date/Time   CHOL 144 06/11/2013 0854   TRIG 102.0 06/11/2013 0854   HDL 47.20 06/11/2013 0854   CHOLHDL 3 06/11/2013 0854   VLDL 20.4 06/11/2013 0854   LDLCALC 76 06/11/2013 0854       Other studies Reviewed: Additional studies/ records that were reviewed today include:  Nuc study 02/2015 .  Overall Impression:  Low risk stress nuclear study with a small, mild, partially reversible inferior defect consistent with inferior thinning and possible minimal inferior ischemia.  LV Ejection Fraction: 54%.  LV Wall Motion:  NL LV Function; NL Wall Motion  ASSESSMENT AND PLAN:   1. CAD: no chest pain or Lt arm pain, rate DOE with heavy exertion, will call if increased.   - - Continue ASA 81, statin.  No beta blocker due to bradycardia.  -  ETT-Cardiolite negative for ischemia last year.    2. Bradycardia: Asymptomatic, good chronotropic response to exercise.  today HR 62  3. Hyperlipidemia: Will check lipids liver and BMET, continue statin.  meds refilled.    Discussed Dr. Aundra Dubin changing to HF only.  Pt would like  to see cardiologist in [place] will check with Dr. Aundra Dubin.    Current medicines are reviewed with the patient today.  The patient Has no concerns regarding medicines.  The following changes have been made:  See above Labs/ tests ordered today include:see above  Disposition:   FU:  see above  Signed, Cecilie Kicks, NP  06/15/2016 3:58 PM    [facility] [address], [place], [place], [place] Phone: [phone]; Fax: [phone]

## 2016-06-15 NOTE — Telephone Encounter (Signed)
-----   Message from Isaiah Serge, NP sent at 06/15/2016  4:36 PM EDT ----- Pt wanted [place] so Dr. Johnsie Cancel in 9 months.  Thanks.

## 2016-06-15 NOTE — Telephone Encounter (Signed)
Per Cecilie Kicks, NP, pt is to f/u with Dr. Johnsie Cancel since Dr. Aundra Dubin will be at [facility] in January.  Pt has been made aware that in 9 mos he is to f/u and he will get a letter when it is time to schedule.  Pt very appreciative and verbalized understanding

## 2016-06-15 NOTE — Progress Notes (Signed)
I think that he lives in [place] or [place].  Would have him seen in [place] or [place].

## 2016-06-15 NOTE — Patient Instructions (Addendum)
Medication Instructions:  Your physician recommends that you continue on your current medications as directed. Please refer to the Current Medication list given to you today.  Labwork: Your physician recommends that you return for lab work in: 1 WEEK FASTING: LIPID AND CMET   Testing/Procedures: NONE ORDERED   Follow-Up: Your physician wants you to follow-up in: [place]. You will receive a reminder letter in the mail two months in advance. If you don't receive a letter, please call our office to schedule the follow-up appointment.  Any Other Special Instructions Will Be Listed Below (If Applicable).  SCHEDULE APPT FOR LABS NEXT WEEK.    If you need a refill on your cardiac medications before your next appointment, please call your pharmacy.

## 2016-06-22 ENCOUNTER — Other Ambulatory Visit: Payer: BLUE CROSS/BLUE SHIELD

## 2016-06-26 ENCOUNTER — Other Ambulatory Visit: Payer: BLUE CROSS/BLUE SHIELD

## 2016-06-29 ENCOUNTER — Other Ambulatory Visit: Payer: BLUE CROSS/BLUE SHIELD | Admitting: *Deleted

## 2016-06-29 DIAGNOSIS — I2510 Atherosclerotic heart disease of native coronary artery without angina pectoris: Secondary | ICD-10-CM

## 2016-06-29 DIAGNOSIS — E785 Hyperlipidemia, unspecified: Secondary | ICD-10-CM

## 2016-06-29 LAB — COMPREHENSIVE METABOLIC PANEL
ALT: 23 U/L (ref 9–46)
AST: 22 U/L (ref 10–35)
Albumin: 4.5 g/dL (ref 3.6–5.1)
Alkaline Phosphatase: 71 U/L (ref 40–115)
BUN: 23 mg/dL (ref 7–25)
CALCIUM: 9.5 mg/dL (ref 8.6–10.3)
CHLORIDE: 104 mmol/L (ref 98–110)
CO2: 25 mmol/L (ref 20–31)
Creat: 1.3 mg/dL — ABNORMAL HIGH (ref 0.70–1.25)
GLUCOSE: 100 mg/dL — AB (ref 65–99)
POTASSIUM: 4.2 mmol/L (ref 3.5–5.3)
Sodium: 138 mmol/L (ref 135–146)
Total Bilirubin: 1 mg/dL (ref 0.2–1.2)
Total Protein: 6.6 g/dL (ref 6.1–8.1)

## 2016-06-29 LAB — LIPID PANEL
CHOL/HDL RATIO: 3.8 ratio (ref <=5.0)
CHOLESTEROL: 139 mg/dL (ref 125–200)
HDL: 37 mg/dL — ABNORMAL LOW (ref >=40)
LDL CALC: 71 mg/dL (ref <130)
TRIGLYCERIDES: 157 mg/dL — AB (ref <150)
VLDL: 31 mg/dL — AB (ref <30)

## 2016-06-30 ENCOUNTER — Other Ambulatory Visit: Payer: Self-pay | Admitting: *Deleted

## 2016-06-30 DIAGNOSIS — R748 Abnormal levels of other serum enzymes: Secondary | ICD-10-CM

## 2016-07-12 ENCOUNTER — Other Ambulatory Visit: Payer: BLUE CROSS/BLUE SHIELD

## 2016-07-19 ENCOUNTER — Other Ambulatory Visit: Payer: BLUE CROSS/BLUE SHIELD

## 2017-03-15 ENCOUNTER — Other Ambulatory Visit: Payer: Self-pay | Admitting: Cardiology

## 2017-03-16 NOTE — Telephone Encounter (Signed)
REFILL 

## 2017-03-19 ENCOUNTER — Other Ambulatory Visit: Payer: Self-pay | Admitting: *Deleted

## 2017-03-19 MED ORDER — ATORVASTATIN CALCIUM 80 MG PO TABS
80.0000 mg | ORAL_TABLET | Freq: Every evening | ORAL | 2 refills | Status: DC
Start: 2017-03-19 — End: 2017-06-08

## 2017-03-19 MED ORDER — LOSARTAN POTASSIUM 50 MG PO TABS: 50.0000 mg | ORAL_TABLET | Freq: Every day | ORAL | 2 refills | Status: DC

## 2017-04-06 ENCOUNTER — Ambulatory Visit: Payer: BLUE CROSS/BLUE SHIELD | Admitting: Cardiovascular Disease

## 2017-04-18 ENCOUNTER — Ambulatory Visit: Payer: BLUE CROSS/BLUE SHIELD | Admitting: Nurse Practitioner

## 2017-04-18 NOTE — Progress Notes (Deleted)
Cardiology Office Note   Date:  04/18/2017   ID:  Terry Powell, DOB [birth year], MRN [id number]  PCP:  Terry Savage, MD  Cardiologist:  Dr. Aundra Dubin    No chief complaint on file.     History of Present Illness: Terry Powell is a 66 y.o. male who presents for CAD, hyperlipidemia, and  HTN  He is new to me having Seen Dr Aundra Dubin and PA;s in past     He has a history of HTN and hyperlipidemia.  Last seen by PA 06/15/16   In 06/2011, patient developed severe substernal chest pain.  He went to the ER at [facility].  Cardiac enzymes were positive, so he was sent to [facility].  Left heart cath showed EF 55% and plaque + significant mobile thrombus in the proximal LAD creating about 80% stenosis.  He was treated with ticagrelor and heparin gtt.  Re-look cath several days later showed only  20-25% proximal LAD stenosis and resolution of the thrombus.  Ticagrelor was continued for a year then stopped.   Myovue  02/17/15 non ischemic   Tends to run on bradycardic side with HR;s 60's   He has 6 acres of land to [employer] [address].  No chest pain has some Lt neck pain with movement.  He is thinking of selling his place and moving to [place].       Past Medical History:  Diagnosis Date  . Arthritis   . Depression    since back surgery  . Gout   . Hypercholesteremia   . Hypertension   . Solitary kidney, congenital     Past Surgical History:  Procedure Laterality Date  . ANKLE FRACTURE SURGERY    . BACK SURGERY    . COLONOSCOPY  06/08/2011   Procedure: COLONOSCOPY;  Surgeon: Dorothyann Peng, MD;  [facility];  Service: Endoscopy;  Laterality: N/A;  . ESOPHAGEAL DILATION  06/08/2011   Procedure: ESOPHAGEAL DILATION;  Surgeon: Dorothyann Peng, MD;  [facility];  Service: Endoscopy;;  . ESOPHAGOGASTRODUODENOSCOPY  06/08/2011   Procedure: ESOPHAGOGASTRODUODENOSCOPY (EGD);  Surgeon: Dorothyann Peng, MD;  [facility];  Service: Endoscopy;  Laterality: N/A;      Current Outpatient Prescriptions  Medication Sig Dispense Refill  . aspirin EC 81 MG tablet Take 1 tablet (81 mg total) by mouth daily.    Marland Kitchen atorvastatin (LIPITOR) 80 MG tablet Take 1 tablet (80 mg total) by mouth every evening. 30 tablet 2  . COLCRYS 0.6 MG tablet Take 0.6 mg by mouth as needed (for gout).     Marland Kitchen FLUoxetine (PROZAC) 20 MG capsule Take 20 mg by mouth daily.     Marland Kitchen losartan (COZAAR) 50 MG tablet Take 1 tablet (50 mg total) by mouth daily. 30 tablet 2  . nitroGLYCERIN (NITROSTAT) 0.4 MG SL tablet Place 1 tablet (0.4 mg total) under the tongue every 5 (five) minutes as needed. 20 tablet 2  . Polyethylene Glycol 3350 GRAN Take 17 g by mouth daily as needed. 527 g 5  . VITAMIN D, CHOLECALCIFEROL, PO Take 2,000 Units by mouth daily.     No current facility-administered medications for this visit.     Allergies:   Contrast media [iodinated diagnostic agents]    Social History:  The patient  reports that he has never smoked. He does not have any smokeless tobacco history on file. He reports that he does not drink alcohol or use drugs.   Family  History:  The patient's family history includes Colon cancer (age of onset: 41) in his maternal grandfather; Heart disease (age of onset: 74) in his father; Ulcers in his mother; Ulcers (age of onset: 48) in his father.    ROS:  General:no colds or fevers, + weight increase from last year Skin:no rashes or ulcers HEENT:no blurred vision, no congestion CV:see HPI PUL:see HPI GI:no diarrhea constipation or melena, no indigestion GU:no hematuria, no dysuria MS:no joint pain, no claudication Neuro:no syncope, no lightheadedness Endo:no diabetes, no thyroid disease  Wt Readings from Last 3 Encounters:  06/15/16 78.5 kg (173 lb)  02/16/15 78 kg (172 lb)  01/21/15 75.8 kg (167 lb)     PHYSICAL EXAM: VS:  There were no vitals taken for this visit. , BMI There is no height or weight on file to calculate BMI. Affect  appropriate Healthy:  appears stated age 45: normal Neck supple with no adenopathy JVP normal no bruits no thyromegaly Lungs clear with no wheezing and good diaphragmatic motion Heart:  S1/S2 no murmur, no rub, gallop or click PMI normal Abdomen: benighn, BS positve, no tenderness, no AAA no bruit.  No HSM or HJR Distal pulses intact with no bruits No edema Neuro non-focal Skin warm and dry No muscular weakness    EKG:  EKG is ordered today. The ekg ordered today demonstrates SR LVH, non specific ST abnormality.  No changes from last year.    Recent Labs: 06/29/2016: ALT 23; BUN 23; Creat 1.30; Potassium 4.2; Sodium 138    Lipid Panel    Component Value Date/Time   CHOL 139 06/29/2016 0944   TRIG 157 (H) 06/29/2016 0944   HDL 37 (L) 06/29/2016 0944   CHOLHDL 3.8 06/29/2016 0944   VLDL 31 (H) 06/29/2016 0944   LDLCALC 71 06/29/2016 0944       Other studies Reviewed: Additional studies/ records that were reviewed today include:  Nuc study 03/2015 .  Overall Impression:  Low risk stress nuclear study with a small, mild, partially reversible inferior defect consistent with inferior thinning and possible minimal inferior ischemia.  LV Ejection Fraction: 54%.  LV Wall Motion:  NL LV Function; NL Wall Motion  ASSESSMENT AND PLAN:   1. CAD: stable .   - - Continue ASA 81, statin.  No beta blocker due to bradycardia.  -  ETT-Cardiolite negative for ischemia 2016    2. Bradycardia: Asymptomatic, good chronotropic response to exercise.  today HR 64  3. Hyperlipidemia: continue statin LDL 31 06/29/16  Discussed Dr. Aundra Dubin changing to HF only.  Pt would like to see cardiologist in [place] will check with Dr. Aundra Dubin.    Jenkins Rouge, MD

## 2017-04-20 ENCOUNTER — Ambulatory Visit: Payer: BLUE CROSS/BLUE SHIELD | Admitting: Cardiovascular Disease

## 2017-05-10 ENCOUNTER — Ambulatory Visit (INDEPENDENT_AMBULATORY_CARE_PROVIDER_SITE_OTHER): Payer: Medicare Other | Admitting: Gastroenterology

## 2017-05-10 ENCOUNTER — Encounter: Payer: Self-pay | Admitting: Gastroenterology

## 2017-05-10 DIAGNOSIS — D124 Benign neoplasm of descending colon: Secondary | ICD-10-CM | POA: Diagnosis not present

## 2017-05-10 DIAGNOSIS — K5909 Other constipation: Secondary | ICD-10-CM | POA: Diagnosis not present

## 2017-05-10 DIAGNOSIS — K222 Esophageal obstruction: Secondary | ICD-10-CM | POA: Diagnosis not present

## 2017-05-10 NOTE — Patient Instructions (Signed)
DRINK WATER TO KEEP YOUR URINE LIGHT YELLOW.  FOLLOW A HIGH FIBER DIET.AVOID ITEMS THAT CAUSE BLOATING & GAS. SEE INFO BELOW.  We will call YOU TO [facility] IN JUL 2019.   High-Fiber Diet A high-fiber diet changes your normal diet to include more whole grains, legumes, fruits, and vegetables. Changes in the diet involve replacing refined carbohydrates with unrefined foods. The calorie level of the diet is essentially unchanged. The Dietary Reference Intake (recommended amount) for adult males is 38 grams per day. For adult females, it is 25 grams per day. Pregnant and lactating women should consume 28 grams of fiber per day. Fiber is the intact part of a plant that is not broken down during digestion. Functional fiber is fiber that has been isolated from the plant to provide a beneficial effect in the body.  PURPOSE  Increase stool bulk.   Ease and regulate bowel movements.   Lower cholesterol.   REDUCE RISK OF COLON CANCER  INDICATIONS THAT YOU NEED MORE FIBER  Constipation and hemorrhoids.   Uncomplicated diverticulosis (intestine condition) and irritable bowel syndrome.   Weight management.   As a protective measure against hardening of the arteries (atherosclerosis), diabetes, and cancer.   GUIDELINES FOR INCREASING FIBER IN THE DIET  Start adding fiber to the diet slowly. A gradual increase of about 5 more grams (2 slices of whole-wheat bread, 2 servings of most fruits or vegetables, or 1 bowl of high-fiber cereal) per day is best. Too rapid an increase in fiber may result in constipation, flatulence, and bloating.   Drink enough water and fluids to keep your urine clear or pale yellow. Water, juice, or caffeine-free drinks are recommended. Not drinking enough fluid may cause constipation.   Eat a variety of high-fiber foods rather than one type of fiber.   Try to increase your intake of fiber through using high-fiber foods rather than fiber pills or  supplements that contain small amounts of fiber.   The goal is to change the types of food eaten. Do not supplement your present diet with high-fiber foods, but replace foods in your present diet.   INCLUDE A VARIETY OF FIBER SOURCES  Replace refined and processed grains with whole grains, canned fruits with fresh fruits, and incorporate other fiber sources. White rice, white breads, and most bakery goods contain little or no fiber.   Brown whole-grain rice, buckwheat oats, and many fruits and vegetables are all good sources of fiber. These include: broccoli, Brussels sprouts, cabbage, cauliflower, beets, sweet potatoes, white potatoes (skin on), carrots, tomatoes, eggplant, squash, berries, fresh fruits, and dried fruits.   Cereals appear to be the richest source of fiber. Cereal fiber is found in whole grains and bran. Bran is the fiber-rich outer coat of cereal grain, which is largely removed in refining. In whole-grain cereals, the bran remains. In breakfast cereals, the largest amount of fiber is found in those with "bran" in their names. The fiber content is sometimes indicated on the label.   You may need to include additional fruits and vegetables each day.   In baking, for 1 cup white flour, you may use the following substitutions:   1 cup whole-wheat flour minus 2 tablespoons.   1/2 cup white flour plus 1/2 cup whole-wheat flour.

## 2017-05-10 NOTE — Assessment & Plan Note (Signed)
SYMPTOMS FAIRLY WELL CONTROLLED.  DRINK WATER EAT FIBER USE COLACE/MIRALAX TO AVOID CONSTIPATION.

## 2017-05-10 NOTE — Progress Notes (Signed)
Subjective:    Patient ID: Terry Powell, male    DOB: 1951/01/25, 66 y.o.   MRN: [id number]  Celedonio Savage, MD   HPI BEEN DOING PRETTY GOOD. BMs: DUE TO CONSTIPATION USES STOOL SOFTENERS HELP BOWELS MOVE. MAY HAVE HEARTBURN IF HE EATS SOMETHING HE SHOULDN'T.  TROUBLE WITH SPLITS IN HIS TONGUE AND IT STAYS RAW. Problems swallowing bread but not as bad as in 2012.,   PT DENIES FEVER, CHILLS, HEMATOCHEZIA, HEMATEMESIS, nausea, vomiting, melena, diarrhea, CHEST PAIN, SHORTNESS OF BREATH,  CHANGE IN BOWEL IN HABITS, abdominal pain, problems with sedation, OR heartburn or indigestion.  Past Medical History:  Diagnosis Date  . Arthritis   . Depression    since back surgery  . Gout   . Hypercholesteremia   . Hypertension   . Solitary kidney, congenital     Past Surgical History:  Procedure Laterality Date  . ANKLE FRACTURE SURGERY    . BACK SURGERY    . COLONOSCOPY  06/08/2011   Procedure: COLONOSCOPY;  Surgeon: Dorothyann Peng, MD;  [facility];  Service: Endoscopy;  Laterality: N/A;  . ESOPHAGEAL DILATION  06/08/2011   Procedure: ESOPHAGEAL DILATION;  Surgeon: Dorothyann Peng, MD;  [facility];  Service: Endoscopy;;  . ESOPHAGOGASTRODUODENOSCOPY  06/08/2011   Procedure: ESOPHAGOGASTRODUODENOSCOPY (EGD);  Surgeon: Dorothyann Peng, MD;  [facility];  Service: Endoscopy;  Laterality: N/A;    Allergies  Allergen Reactions  . Contrast Media [Iodinated Diagnostic Agents] Shortness Of Breath and Other (See Comments)       Current Outpatient Prescriptions  Medication Sig Dispense Refill  . aspirin EC 81 MG tablet Take 1 tablet (81 mg total) by mouth daily.    Marland Kitchen atorvastatin (LIPITOR) 80 MG tablet Take 1 tablet (80 mg total) by mouth every evening.    Marland Kitchen COLCRYS 0.6 MG tablet Take 0.6 mg by mouth as needed (for gout).     Marland Kitchen docusate sodium (COLACE) 100 MG capsule Take 300 mg by mouth at bedtime.    Marland Kitchen FLUoxetine (PROZAC) 20 MG capsule Take 20 mg by mouth  daily.     Marland Kitchen losartan (COZAAR) 50 MG tablet Take 1 tablet (50 mg total) by mouth daily.    . nitroGLYCERIN (NITROSTAT) 0.4 MG SL tablet Place 1 tablet (0.4 mg total) under the tongue every 5 (five) minutes as needed.    . Polyethylene Glycol 3350 GRAN Take 17 g by mouth daily as needed.  QD USUALLY   . VITAMIN D, CHOLECALCIFEROL, PO Take 2,000 Units by mouth daily.     Review of Systems PER HPI OTHERWISE ALL SYSTEMS ARE NEGATIVE.    Objective:   Physical Exam  Constitutional: He is oriented to person, place, and time. He appears well-developed and well-nourished. No distress.  HENT:  Head: Normocephalic and atraumatic.  Mouth/Throat: Oropharynx is clear and moist. No oropharyngeal exudate.  Eyes: Pupils are equal, round, and reactive to light. No scleral icterus.  Neck: Normal range of motion. Neck supple.  Cardiovascular: Normal rate, regular rhythm and normal heart sounds.   Pulmonary/Chest: Effort normal and breath sounds normal. No respiratory distress.  Abdominal: Soft. Bowel sounds are normal. He exhibits no distension. There is no tenderness.  Musculoskeletal: He exhibits no edema.  Lymphadenopathy:    He has no cervical adenopathy.  Neurological: He is alert and oriented to person, place, and time.  NO FOCAL DEFICITS  Psychiatric: He has a normal mood and affect.  Vitals reviewed.  Assessment & Plan:

## 2017-05-10 NOTE — Assessment & Plan Note (Signed)
NO WARNING SIGNS/SYMPTOMS  DRINK WATER TO KEEP YOUR URINE LIGHT YELLOW. FOLLOW A HIGH FIBER DIET.AVOID ITEMS THAT CAUSE BLOATING & GAS.  HANDOUT GIVEN. TRIAGE FOR TCS/EGD/DIL IN JUL 2019 (SUPREP, REG BREAKFAST, CLEAR LIQUIDS AFTER 9 AM)

## 2017-05-10 NOTE — Assessment & Plan Note (Signed)
INTERMITTENT AND NOT A PROBLEM CURRENTLY.  CONTINUE TO MONITOR SYMPTOMS. Triage for EGD/DIL l in Jul 2019. Pt WILL CALL WITH QUESTIONS OR CONCERNS.
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
## 2017-06-08 ENCOUNTER — Ambulatory Visit (INDEPENDENT_AMBULATORY_CARE_PROVIDER_SITE_OTHER): Payer: Medicare Other | Admitting: Cardiovascular Disease

## 2017-06-08 ENCOUNTER — Encounter (INDEPENDENT_AMBULATORY_CARE_PROVIDER_SITE_OTHER): Payer: Self-pay

## 2017-06-08 ENCOUNTER — Encounter: Payer: Self-pay | Admitting: Cardiovascular Disease

## 2017-06-08 VITALS — BP 138/98 | HR 51 | Ht 69.0 in | Wt 170.0 lb

## 2017-06-08 DIAGNOSIS — E7849 Other hyperlipidemia: Secondary | ICD-10-CM

## 2017-06-08 DIAGNOSIS — I2510 Atherosclerotic heart disease of native coronary artery without angina pectoris: Secondary | ICD-10-CM | POA: Diagnosis not present

## 2017-06-08 DIAGNOSIS — N400 Enlarged prostate without lower urinary tract symptoms: Secondary | ICD-10-CM | POA: Diagnosis not present

## 2017-06-08 DIAGNOSIS — E784 Other hyperlipidemia: Secondary | ICD-10-CM

## 2017-06-08 MED ORDER — LOSARTAN POTASSIUM 50 MG PO TABS: 50.0000 mg | ORAL_TABLET | Freq: Every day | ORAL | 3 refills | Status: DC

## 2017-06-08 MED ORDER — ATORVASTATIN CALCIUM 80 MG PO TABS
80.0000 mg | ORAL_TABLET | Freq: Every evening | ORAL | 3 refills | Status: DC
Start: 2017-06-08 — End: 2018-06-10

## 2017-06-08 NOTE — Patient Instructions (Addendum)
Medication Instructions:  Your physician recommends that you continue on your current medications as directed. Please refer to the Current Medication list given to you today.  Labwork: Your physician recommends that you have lab work today- CBC, CMET, PSA, and Lipid  Testing/Procedures: NONE  Follow-Up: Your physician wants you to follow-up in: 12 months with Dr. Johnsie Cancel in [place]. You will receive a reminder letter in the mail two months in advance. If you don't receive a letter, please call our office to schedule the follow-up appointment.   If you need a refill on your cardiac medications before your next appointment, please call your pharmacy.

## 2017-06-09 LAB — COMPREHENSIVE METABOLIC PANEL
ALBUMIN: 4.7 g/dL (ref 3.6–4.8)
ALK PHOS: 88 IU/L (ref 39–117)
ALT: 23 IU/L (ref 0–44)
AST: 24 IU/L (ref 0–40)
Albumin/Globulin Ratio: 2.1 (ref 1.2–2.2)
BUN / CREAT RATIO: 12 (ref 10–24)
BUN: 13 mg/dL (ref 8–27)
Bilirubin Total: 0.5 mg/dL (ref 0.0–1.2)
CHLORIDE: 103 mmol/L (ref 96–106)
CO2: 22 mmol/L (ref 20–29)
CREATININE: 1.12 mg/dL (ref 0.76–1.27)
Calcium: 9.7 mg/dL (ref 8.6–10.2)
GFR calc Af Amer: 79 mL/min/{1.73_m2} (ref >59)
GFR calc non Af Amer: 69 mL/min/{1.73_m2} (ref >59)
GLUCOSE: 92 mg/dL (ref 65–99)
Globulin, Total: 2.2 g/dL (ref 1.5–4.5)
Potassium: 4.5 mmol/L (ref 3.5–5.2)
Sodium: 143 mmol/L (ref 134–144)
TOTAL PROTEIN: 6.9 g/dL (ref 6.0–8.5)

## 2017-06-09 LAB — CBC WITH DIFFERENTIAL/PLATELET
BASOS ABS: 0 10*3/uL (ref 0.0–0.2)
Basos: 1 %
EOS (ABSOLUTE): 0.2 10*3/uL (ref 0.0–0.4)
Eos: 3 %
Hematocrit: 48.2 % (ref 37.5–51.0)
Hemoglobin: 17.1 g/dL (ref 13.0–17.7)
Immature Grans (Abs): 0 10*3/uL (ref 0.0–0.1)
Immature Granulocytes: 0 %
LYMPHS ABS: 1.9 10*3/uL (ref 0.7–3.1)
Lymphs: 30 %
MCH: 30.2 pg (ref 26.6–33.0)
MCHC: 35.5 g/dL (ref 31.5–35.7)
MCV: 85 fL (ref 79–97)
MONOS ABS: 0.6 10*3/uL (ref 0.1–0.9)
Monocytes: 9 %
NEUTROS ABS: 3.6 10*3/uL (ref 1.4–7.0)
Neutrophils: 57 %
PLATELETS: 198 10*3/uL (ref 150–379)
RBC: 5.66 x10E6/uL (ref 4.14–5.80)
RDW: 13.8 % (ref 12.3–15.4)
WBC: 6.3 10*3/uL (ref 3.4–10.8)

## 2017-06-09 LAB — LIPID PANEL
CHOL/HDL RATIO: 3.8 ratio (ref 0.0–5.0)
Cholesterol, Total: 149 mg/dL (ref 100–199)
HDL: 39 mg/dL — ABNORMAL LOW (ref >39)
LDL Calculated: 75 mg/dL (ref 0–99)
Triglycerides: 177 mg/dL — ABNORMAL HIGH (ref 0–149)
VLDL CHOLESTEROL CAL: 35 mg/dL (ref 5–40)

## 2017-06-09 LAB — PSA: PROSTATE SPECIFIC AG, SERUM: 3.1 ng/mL (ref 0.0–4.0)

## 2017-06-27 DIAGNOSIS — R3911 Hesitancy of micturition: Secondary | ICD-10-CM | POA: Diagnosis not present

## 2017-06-27 DIAGNOSIS — I10 Essential (primary) hypertension: Secondary | ICD-10-CM | POA: Diagnosis not present

## 2017-06-27 DIAGNOSIS — Z299 Encounter for prophylactic measures, unspecified: Secondary | ICD-10-CM | POA: Diagnosis not present

## 2017-06-27 DIAGNOSIS — Z6825 Body mass index (BMI) 25.0-25.9, adult: Secondary | ICD-10-CM | POA: Diagnosis not present

## 2017-06-27 DIAGNOSIS — Z789 Other specified health status: Secondary | ICD-10-CM | POA: Diagnosis not present

## 2017-06-27 DIAGNOSIS — F329 Major depressive disorder, single episode, unspecified: Secondary | ICD-10-CM | POA: Diagnosis not present

## 2017-06-27 DIAGNOSIS — N390 Urinary tract infection, site not specified: Secondary | ICD-10-CM | POA: Diagnosis not present

## 2017-07-13 DIAGNOSIS — D2261 Melanocytic nevi of right upper limb, including shoulder: Secondary | ICD-10-CM | POA: Diagnosis not present

## 2017-07-13 DIAGNOSIS — L814 Other melanin hyperpigmentation: Secondary | ICD-10-CM | POA: Diagnosis not present

## 2017-07-13 DIAGNOSIS — L570 Actinic keratosis: Secondary | ICD-10-CM | POA: Diagnosis not present

## 2017-07-13 DIAGNOSIS — D225 Melanocytic nevi of trunk: Secondary | ICD-10-CM | POA: Diagnosis not present

## 2017-07-13 DIAGNOSIS — L821 Other seborrheic keratosis: Secondary | ICD-10-CM | POA: Diagnosis not present

## 2017-07-25 ENCOUNTER — Ambulatory Visit: Payer: Medicare Other | Admitting: Family Medicine

## 2017-08-08 DIAGNOSIS — Z6825 Body mass index (BMI) 25.0-25.9, adult: Secondary | ICD-10-CM | POA: Diagnosis not present

## 2017-08-08 DIAGNOSIS — Z713 Dietary counseling and surveillance: Secondary | ICD-10-CM | POA: Diagnosis not present

## 2017-08-08 DIAGNOSIS — F329 Major depressive disorder, single episode, unspecified: Secondary | ICD-10-CM | POA: Diagnosis not present

## 2017-08-08 DIAGNOSIS — Z2821 Immunization not carried out because of patient refusal: Secondary | ICD-10-CM | POA: Diagnosis not present

## 2017-08-08 DIAGNOSIS — Z299 Encounter for prophylactic measures, unspecified: Secondary | ICD-10-CM | POA: Diagnosis not present

## 2017-08-08 DIAGNOSIS — I10 Essential (primary) hypertension: Secondary | ICD-10-CM | POA: Diagnosis not present

## 2017-09-03 ENCOUNTER — Encounter (INDEPENDENT_AMBULATORY_CARE_PROVIDER_SITE_OTHER): Payer: Self-pay

## 2017-09-03 ENCOUNTER — Encounter: Payer: Self-pay | Admitting: Family Medicine

## 2017-09-03 ENCOUNTER — Ambulatory Visit (INDEPENDENT_AMBULATORY_CARE_PROVIDER_SITE_OTHER): Payer: Medicare Other | Admitting: Family Medicine

## 2017-09-03 VITALS — BP 122/80 | HR 56 | Temp 98.0°F | Resp 16 | Ht 69.0 in | Wt 173.1 lb

## 2017-09-03 DIAGNOSIS — Z23 Encounter for immunization: Secondary | ICD-10-CM | POA: Diagnosis not present

## 2017-09-03 DIAGNOSIS — I2510 Atherosclerotic heart disease of native coronary artery without angina pectoris: Secondary | ICD-10-CM | POA: Diagnosis not present

## 2017-09-03 DIAGNOSIS — F329 Major depressive disorder, single episode, unspecified: Secondary | ICD-10-CM

## 2017-09-03 DIAGNOSIS — E7849 Other hyperlipidemia: Secondary | ICD-10-CM | POA: Diagnosis not present

## 2017-09-03 DIAGNOSIS — R001 Bradycardia, unspecified: Secondary | ICD-10-CM

## 2017-09-03 DIAGNOSIS — E559 Vitamin D deficiency, unspecified: Secondary | ICD-10-CM

## 2017-09-03 DIAGNOSIS — F32A Depression, unspecified: Secondary | ICD-10-CM

## 2017-09-03 NOTE — Patient Instructions (Addendum)
See me in March for a physcial Need blood work next time [facility] about the shingles shot(s)  Need Dr Wenda Overland records

## 2017-09-03 NOTE — Progress Notes (Signed)
Chief Complaint  Patient presents with  . Hyperlipidemia  This is a new patient to the office.  His prior physician died suddenly leaving him with no primary care. He is under the care of cardiology.  He has had recent blood work He has a history of coronary artery disease.  It has been treated conservatively. He has a history of bradycardia and hyperlipidemia.  He takes atorvastatin daily Which he eats well, exercises, and maintains a desirable weight He does not smoke cigarettes or drink alcohol. He does have a past history of depression.  He is maintained on chronic fluoxetine.  He has no current symptoms of anxiety or depression. He is up-to-date with his colonoscopy. He is never had a flu shot.  He agrees to a flu shot and Prevnar today.  We also discussed it is desirable to have the shingles vaccination.  He states his tetanus was 4 years ago.    Patient Active Problem List   Diagnosis Date Noted  . Adenomatous polyp of descending colon 05/10/2017  . Stricture and stenosis of esophagus 05/10/2017  . Bradycardia 06/11/2013  . CAD (coronary artery disease) 07/27/2011  . Hyperlipidemia 07/27/2011  . Chronic constipation 05/22/2011  . Clinical depression 07/08/2010  . HLD (hyperlipidemia) 07/08/2010    Outpatient Encounter Prescriptions as of 09/03/2017  Medication Sig  . aspirin EC 81 MG tablet Take 1 tablet (81 mg total) by mouth daily.  Marland Kitchen atorvastatin (LIPITOR) 80 MG tablet Take 1 tablet (80 mg total) by mouth every evening.  Marland Kitchen COLCRYS 0.6 MG tablet Take 0.6 mg by mouth as needed (for gout).   Marland Kitchen docusate sodium (COLACE) 100 MG capsule Take 300 mg by mouth at bedtime.  Marland Kitchen FLUoxetine (PROZAC) 20 MG capsule Take 20 mg by mouth daily.   Marland Kitchen losartan (COZAAR) 50 MG tablet Take 1 tablet (50 mg total) by mouth daily.  . nitroGLYCERIN (NITROSTAT) 0.4 MG SL tablet Place 1 tablet (0.4 mg total) under the tongue every 5 (five) minutes as needed.  . Polyethylene Glycol 3350 GRAN Take 17  g by mouth daily as needed.  Marland Kitchen VITAMIN D, CHOLECALCIFEROL, PO Take 2,000 Units by mouth daily.  . fluorouracil (EFUDEX) 5 % cream    No facility-administered encounter medications on file as of 09/03/2017.     Past Medical History:  Diagnosis Date  . Allergy    IV dye, seasonal  . Anxiety   . Arthritis   . Blood transfusion without reported diagnosis   . Cataract   . Clotting disorder ([place])   . Depression    since back surgery  . Gout   . Hypercholesteremia   . Hypertension   . Myocardial infarction ([place])   . Solitary kidney, congenital     Past Surgical History:  Procedure Laterality Date  . ANKLE FRACTURE SURGERY    . BACK SURGERY    . COLONOSCOPY  06/08/2011   Procedure: COLONOSCOPY;  Surgeon: Dorothyann Peng, MD;  [facility];  Service: Endoscopy;  Laterality: N/A;  . ESOPHAGEAL DILATION  06/08/2011   Procedure: ESOPHAGEAL DILATION;  Surgeon: Dorothyann Peng, MD;  [facility];  Service: Endoscopy;;  . ESOPHAGOGASTRODUODENOSCOPY  06/08/2011   Procedure: ESOPHAGOGASTRODUODENOSCOPY (EGD);  Surgeon: Dorothyann Peng, MD;  [facility];  Service: Endoscopy;  Laterality: N/A;  . [place]   2-3 discs injured , fusion  . TONSILLECTOMY    . VASECTOMY      Social History  Social History  . Marital status: Divorced    Spouse name: N/A  . Number of children: 2  . Years of education: 16   Occupational History  . retired     Emergency planning/management officer   Social History Main Topics  . Smoking status: Never Smoker  . Smokeless tobacco: Never Used  . Alcohol use No  . Drug use: No  . Sexual activity: Yes    Birth control/ protection: Surgical   Other Topics Concern  . Not on file   Social History Narrative   Retired [employer] alone   2 daughters - 2 grandsons   Lives on 6 acres   Works on [employer]    Family History  Problem Relation Age of Onset  . Ulcers Father 58       partial gastrectomy  . Heart disease Father  48  . Arthritis Father   . Hyperlipidemia Father   . Ulcers Mother        stomach  . Arthritis Mother   . Asthma Mother   . COPD Mother   . Depression Mother   . Diabetes Mother   . Hyperlipidemia Mother   . Hypertension Mother   . Kidney disease Mother   . Colon cancer Maternal Grandfather 42  . Stroke Maternal Grandfather   . Cancer Maternal Grandfather 45       colon  . Stroke Paternal Grandfather   . Liver disease Neg Hx     Review of Systems  Constitutional: Negative for chills, fever and weight loss.  HENT: Negative for congestion and hearing loss.   Eyes: Negative for blurred vision and pain.       History of early cataracts with no vision impairment  Respiratory: Negative for cough and shortness of breath.   Cardiovascular: Negative for chest pain and leg swelling.  Gastrointestinal: Negative for abdominal pain, constipation, diarrhea and heartburn.  Genitourinary: Negative for dysuria and frequency.  Musculoskeletal: Negative for falls, joint pain and myalgias.       History of back surgery with occasional back pain  Neurological: Negative for dizziness, seizures and headaches.  Psychiatric/Behavioral: Negative for depression. The patient is not nervous/anxious and does not have insomnia.     BP 122/80 (BP Location: Right Arm, Patient Position: Sitting, Cuff Size: Normal)   Pulse (!) 56   Temp 98 F (36.7 C) (Temporal)   Resp 16   Ht 5\' 9"  (1.753 m)   Wt 173 lb 1.9 oz (78.5 kg)   SpO2 99%   BMI 25.57 kg/m   Physical Exam  Constitutional: He is oriented to person, place, and time. He appears well-developed and well-nourished.  HENT:  Head: Normocephalic and atraumatic.  Mouth/Throat: Oropharynx is clear and moist.  Cerumen bilateral.  Discussed home treatment  Eyes: Pupils are equal, round, and reactive to light. Conjunctivae are normal.  Early cataracts obscure fundi  Neck: Normal range of motion. Neck supple. No thyromegaly present.  Cardiovascular:  Normal rate, regular rhythm and normal heart sounds.   Pulmonary/Chest: Effort normal and breath sounds normal. No respiratory distress.  Abdominal: Soft. Bowel sounds are normal.  Musculoskeletal: Normal range of motion. He exhibits no edema.  Lymphadenopathy:    He has no cervical adenopathy.  Neurological: He is alert and oriented to person, place, and time.  Gait normal  Skin: Skin is warm and dry.  Psychiatric: He has a normal mood and affect. His behavior is normal. Thought content normal.  Nursing note  and vitals reviewed. ASSESSMENT/PLAN:   1. Coronary artery disease involving native coronary artery of native heart without angina pectoris Under care of cardiology.  No chest pain dyspnea pedal edema or cardiac symptoms - CBC - COMPLETE METABOLIC PANEL WITH GFR - Lipid panel - VITAMIN D 25 Hydroxy (Vit-D Deficiency, Fractures) - Urinalysis, Routine w reflex microscopic  2. Other hyperlipidemia Compliant with Lipitor  3. Bradycardia Asymptomatic.  Prevents use of beta-blockers  4. Depression, unspecified depression type History of.  Maintained on fluoxetine  5. Vitamin D deficiency - VITAMIN D 25 Hydroxy (Vit-D Deficiency, Fractures)  6. Need for influenza vaccination - Flu Vaccine QUAD 36+ mos IM  7. Need for pneumococcal vaccination - Pneumococcal conjugate vaccine 13-valent IM   Patient Instructions  See me in March for a physcial Need blood work next time [facility] about the shingles shot(s)  Need Dr Wenda Overland records   Raylene Everts, MD

## 2017-12-14 DIAGNOSIS — H01025 Squamous blepharitis left lower eyelid: Secondary | ICD-10-CM | POA: Diagnosis not present

## 2017-12-14 DIAGNOSIS — H01024 Squamous blepharitis left upper eyelid: Secondary | ICD-10-CM | POA: Diagnosis not present

## 2017-12-14 DIAGNOSIS — H25813 Combined forms of age-related cataract, bilateral: Secondary | ICD-10-CM | POA: Diagnosis not present

## 2017-12-14 DIAGNOSIS — H01022 Squamous blepharitis right lower eyelid: Secondary | ICD-10-CM | POA: Diagnosis not present

## 2017-12-14 DIAGNOSIS — H01021 Squamous blepharitis right upper eyelid: Secondary | ICD-10-CM | POA: Diagnosis not present

## 2018-01-04 ENCOUNTER — Encounter: Payer: Medicare Other | Admitting: Family Medicine

## 2018-01-11 ENCOUNTER — Encounter (INDEPENDENT_AMBULATORY_CARE_PROVIDER_SITE_OTHER): Payer: Self-pay

## 2018-01-11 ENCOUNTER — Encounter: Payer: Self-pay | Admitting: Family Medicine

## 2018-01-11 ENCOUNTER — Ambulatory Visit (INDEPENDENT_AMBULATORY_CARE_PROVIDER_SITE_OTHER): Payer: Medicare Other | Admitting: Family Medicine

## 2018-01-11 VITALS — BP 134/82 | HR 55 | Temp 98.0°F | Ht 69.0 in | Wt 172.2 lb

## 2018-01-11 DIAGNOSIS — Z9889 Other specified postprocedural states: Secondary | ICD-10-CM

## 2018-01-11 DIAGNOSIS — I2510 Atherosclerotic heart disease of native coronary artery without angina pectoris: Secondary | ICD-10-CM

## 2018-01-11 DIAGNOSIS — Z1159 Encounter for screening for other viral diseases: Secondary | ICD-10-CM

## 2018-01-11 DIAGNOSIS — M10071 Idiopathic gout, right ankle and foot: Secondary | ICD-10-CM | POA: Insufficient documentation

## 2018-01-11 DIAGNOSIS — Z0000 Encounter for general adult medical examination without abnormal findings: Secondary | ICD-10-CM

## 2018-01-11 DIAGNOSIS — M47816 Spondylosis without myelopathy or radiculopathy, lumbar region: Secondary | ICD-10-CM | POA: Insufficient documentation

## 2018-01-11 MED ORDER — COLCHICINE 0.6 MG PO CAPS: ORAL_CAPSULE | ORAL | 11 refills | Status: AC

## 2018-01-11 NOTE — Progress Notes (Signed)
Chief Complaint  Patient presents with  . Annual Exam    gout issues     Patient is here for his annual physical examination. He is well.  Eats well and exercises. He does have an acute complaint of gout in his right great toe.  Requests a refill of colchicine.  He is uses before successfully. He complains that he is having increased low back pain with pain down into his right leg.  He would like to see a neurosurgeon.  He had back surgery in the 1990s.  He is having progressively increased difficulty.  He knows what stretching and exercises to do.  He is using over-the-counter medicines for pain.  He feels he may need additional imaging and treatment. He is up-to-date with his immunizations and healthcare maintenance.  Patient Active Problem List   Diagnosis Date Noted  . Degenerative joint disease (DJD) of lumbar spine 01/11/2018  . History of back surgery 01/11/2018  . Acute idiopathic gout involving toe of right foot 01/11/2018  . Adenomatous polyp of descending colon 05/10/2017  . Stricture and stenosis of esophagus 05/10/2017  . Bradycardia 06/11/2013  . CAD (coronary artery disease) 07/27/2011  . Hyperlipidemia 07/27/2011  . Chronic constipation 05/22/2011  . Clinical depression 07/08/2010  . HLD (hyperlipidemia) 07/08/2010    Outpatient Encounter Medications as of 01/11/2018  Medication Sig  . aspirin EC 81 MG tablet Take 1 tablet (81 mg total) by mouth daily.  Marland Kitchen atorvastatin (LIPITOR) 80 MG tablet Take 1 tablet (80 mg total) by mouth every evening.  . docusate sodium (COLACE) 100 MG capsule Take 300 mg by mouth at bedtime.  Marland Kitchen FLUoxetine (PROZAC) 20 MG capsule Take 20 mg by mouth daily.   Marland Kitchen losartan (COZAAR) 50 MG tablet Take 1 tablet (50 mg total) by mouth daily.  . Polyethylene Glycol 3350 GRAN Take 17 g by mouth daily as needed.  . [DISCONTINUED] COLCRYS 0.6 MG tablet Take 0.6 mg by mouth as needed (for gout).   . Colchicine (MITIGARE) 0.6 MG CAPS Take 2 pills at  the first sign of gout.  Repeat dose at one hour Then one a day until gone  . fluorouracil (EFUDEX) 5 % cream   . nitroGLYCERIN (NITROSTAT) 0.4 MG SL tablet Place 1 tablet (0.4 mg total) under the tongue every 5 (five) minutes as needed. (Patient not taking: Reported on 01/11/2018)  . VITAMIN D, CHOLECALCIFEROL, PO Take 2,000 Units by mouth daily.   No facility-administered encounter medications on file as of 01/11/2018.     Allergies  Allergen Reactions  . Contrast Media [Iodinated Diagnostic Agents] Shortness Of Breath and Other (See Comments)    Shakes. Evaluated and treated in ED. 1980s.  Patient has one kidney.    Review of Systems  Constitutional: Negative for activity change, appetite change and unexpected weight change.  HENT: Negative for congestion and dental problem.   Eyes: Negative for redness and visual disturbance.  Respiratory: Negative for cough and shortness of breath.   Cardiovascular: Negative for chest pain, palpitations and leg swelling.  Gastrointestinal: Negative for blood in stool, constipation and diarrhea.  Genitourinary: Negative for difficulty urinating and frequency.  Musculoskeletal: Positive for arthralgias, back pain, gait problem and joint swelling.  Neurological: Negative for dizziness and headaches.  Psychiatric/Behavioral: Negative for dysphoric mood and sleep disturbance. The patient is not nervous/anxious.     BP 134/82 (BP Location: Right Arm, Patient Position: Sitting, Cuff Size: Normal)   Pulse (!) 55   Temp 98  F (36.7 C) (Temporal)   Ht 5\' 9"  (1.753 m)   Wt 172 lb 4 oz (78.1 kg)   SpO2 98%   BMI 25.44 kg/m   Physical Exam  BP 134/82 (BP Location: Right Arm, Patient Position: Sitting, Cuff Size: Normal)   Pulse (!) 55   Temp 98 F (36.7 C) (Temporal)   Ht 5\' 9"  (1.753 m)   Wt 172 lb 4 oz (78.1 kg)   SpO2 98%   BMI 25.44 kg/m   General Appearance:    Alert, cooperative, no distress, appears stated age  Head:    Normocephalic,  without obvious abnormality, atraumatic  Eyes:    PERRL, conjunctiva/corneas clear, EOM's intact, fundi    benign, both eyes       Ears:    Normal TM's and external ear canals, both ears  Nose:   Nares normal, septum midline, mucosa normal, no drainage   or sinus tenderness  Throat:   Lips, mucosa, and tongue normal; teeth and gums normal  Neck:   Supple, symmetrical, trachea midline, no adenopathy;       thyroid:  No enlargement/tenderness/nodules; no carotid   bruit or JVD  Back:     Symmetric, no curvature, ROM normal, no CVA tenderness  Lungs:     Clear to auscultation bilaterally, respirations unlabored  Chest wall:    No tenderness or deformity  Heart:    Regular rate and rhythm, S1 and S2 normal, no murmur, rub   or gallop  Abdomen:     Soft, non-tender, bowel sounds active all four quadrants,    no masses, no organomegaly  Genitalia:    Normal male without lesion, discharge or tenderness  Extremities:   Extremities normal, atraumatic, no cyanosis or edema.  Right great toe MTP has erythema swelling tenderness pain with range of motion  Pulses:   2+ and symmetric all extremities  Skin:   Skin color, texture, turgor normal, no rashes or lesions  Lymph nodes:   Cervical, supraclavicular, and axillary nodes normal  Neurologic:   Normal strength, sensation and reflexes      throughout     ASSESSMENT/PLAN:  1. Annual physical exam Generally normal exam with no unexpected findings.  Patient does have acute gout.  2. Osteoarthritis of lumbar spine, unspecified spinal osteoarthritis complication status By history - Ambulatory referral to Neurosurgery  3. History of back surgery By history - Ambulatory referral to Neurosurgery  4. Acute idiopathic gout involving toe of right foot Acutely inflamed.  Discussed appearing foods.  Discussed alcohol.  Discussed hydration.  Refilled colchicine. - Uric acid  5. Coronary artery disease involving native coronary artery of native heart  without angina pectoris Under care of cardiology.  Gets lab work yearly.  Due in August - CBC - COMPLETE METABOLIC PANEL WITH GFR - Lipid panel - Urinalysis, Routine w reflex microscopic  6. Encounter for hepatitis C screening test for low risk patient Recommended screening - Hepatitis C antibody   Patient Instructions  My staff will call again for your old records from Dr. Wenda Overland  Continue your current medicines and treatments.  I have placed a referral to Dr. Vertell Limber at [facility] for a consultation.  I have refilled your colchicine.  Note new instructions.  Call for problems.  Need repeat lab test and evaluation in August, approximately 6 months   you will see Dr. Mannie Stabile at your next visit   Raylene Everts, MD

## 2018-01-11 NOTE — Patient Instructions (Signed)
My staff will call again for your old records from Dr. Wenda Overland  Continue your current medicines and treatments.  I have placed a referral to Dr. Vertell Limber at [facility] for a consultation.  I have refilled your colchicine.  Note new instructions.  Call for problems.  Need repeat lab test and evaluation in August, approximately 6 months   you will see Dr. Mannie Stabile at your next visit

## 2018-03-18 DIAGNOSIS — M5416 Radiculopathy, lumbar region: Secondary | ICD-10-CM | POA: Diagnosis not present

## 2018-03-18 DIAGNOSIS — M4316 Spondylolisthesis, lumbar region: Secondary | ICD-10-CM | POA: Diagnosis not present

## 2018-03-18 DIAGNOSIS — M545 Low back pain: Secondary | ICD-10-CM | POA: Diagnosis not present

## 2018-03-20 DIAGNOSIS — Z6824 Body mass index (BMI) 24.0-24.9, adult: Secondary | ICD-10-CM | POA: Diagnosis not present

## 2018-03-20 DIAGNOSIS — M549 Dorsalgia, unspecified: Secondary | ICD-10-CM | POA: Diagnosis not present

## 2018-03-20 DIAGNOSIS — Z789 Other specified health status: Secondary | ICD-10-CM | POA: Diagnosis not present

## 2018-03-20 DIAGNOSIS — Z299 Encounter for prophylactic measures, unspecified: Secondary | ICD-10-CM | POA: Diagnosis not present

## 2018-03-20 DIAGNOSIS — I10 Essential (primary) hypertension: Secondary | ICD-10-CM | POA: Diagnosis not present

## 2018-03-20 DIAGNOSIS — R1030 Lower abdominal pain, unspecified: Secondary | ICD-10-CM | POA: Diagnosis not present

## 2018-03-23 ENCOUNTER — Other Ambulatory Visit: Payer: Self-pay | Admitting: Neurosurgery

## 2018-03-23 DIAGNOSIS — Z77018 Contact with and (suspected) exposure to other hazardous metals: Secondary | ICD-10-CM

## 2018-03-23 DIAGNOSIS — M4316 Spondylolisthesis, lumbar region: Secondary | ICD-10-CM

## 2018-03-29 ENCOUNTER — Ambulatory Visit
Admission: RE | Admit: 2018-03-29 | Discharge: 2018-03-29 | Disposition: A | Discharge status: Home / Self Care | Payer: Medicare Other | Source: Ambulatory Visit | Attending: Neurosurgery | Admitting: Neurosurgery

## 2018-03-29 DIAGNOSIS — M4316 Spondylolisthesis, lumbar region: Secondary | ICD-10-CM

## 2018-03-29 DIAGNOSIS — Z77018 Contact with and (suspected) exposure to other hazardous metals: Secondary | ICD-10-CM

## 2018-03-29 DIAGNOSIS — Z135 Encounter for screening for eye and ear disorders: Secondary | ICD-10-CM | POA: Diagnosis not present

## 2018-03-29 DIAGNOSIS — M5126 Other intervertebral disc displacement, lumbar region: Secondary | ICD-10-CM | POA: Diagnosis not present

## 2018-03-29 MED ORDER — GADOBENATE DIMEGLUMINE 529 MG/ML IV SOLN
15.0000 mL | Freq: Once | INTRAVENOUS | Status: AC | PRN
  Administered 2018-03-29: 15 mL via INTRAVENOUS

## 2018-04-10 DIAGNOSIS — M5416 Radiculopathy, lumbar region: Secondary | ICD-10-CM | POA: Diagnosis not present

## 2018-04-10 DIAGNOSIS — I10 Essential (primary) hypertension: Secondary | ICD-10-CM | POA: Diagnosis not present

## 2018-04-10 DIAGNOSIS — M545 Low back pain: Secondary | ICD-10-CM | POA: Diagnosis not present

## 2018-04-10 DIAGNOSIS — M4316 Spondylolisthesis, lumbar region: Secondary | ICD-10-CM | POA: Diagnosis not present

## 2018-04-10 DIAGNOSIS — Z6825 Body mass index (BMI) 25.0-25.9, adult: Secondary | ICD-10-CM | POA: Diagnosis not present

## 2018-04-10 DIAGNOSIS — M9983 Other biomechanical lesions of lumbar region: Secondary | ICD-10-CM | POA: Diagnosis not present

## 2018-04-11 ENCOUNTER — Encounter: Payer: Self-pay | Admitting: Family Medicine

## 2018-04-12 ENCOUNTER — Encounter: Payer: Self-pay | Admitting: Family Medicine

## 2018-04-30 ENCOUNTER — Encounter: Payer: Self-pay | Admitting: Gastroenterology

## 2018-04-30 ENCOUNTER — Ambulatory Visit (INDEPENDENT_AMBULATORY_CARE_PROVIDER_SITE_OTHER): Payer: Medicare Other | Admitting: Gastroenterology

## 2018-04-30 ENCOUNTER — Other Ambulatory Visit: Payer: Self-pay

## 2018-04-30 ENCOUNTER — Encounter

## 2018-04-30 VITALS — BP 126/87 | HR 52 | Temp 97.0°F | Ht 69.0 in | Wt 169.4 lb

## 2018-04-30 DIAGNOSIS — R1319 Other dysphagia: Secondary | ICD-10-CM

## 2018-04-30 DIAGNOSIS — K5909 Other constipation: Secondary | ICD-10-CM

## 2018-04-30 DIAGNOSIS — K219 Gastro-esophageal reflux disease without esophagitis: Secondary | ICD-10-CM

## 2018-04-30 DIAGNOSIS — Z86010 Personal history of colonic polyps: Secondary | ICD-10-CM | POA: Diagnosis not present

## 2018-04-30 DIAGNOSIS — I2510 Atherosclerotic heart disease of native coronary artery without angina pectoris: Secondary | ICD-10-CM | POA: Diagnosis not present

## 2018-04-30 DIAGNOSIS — R1310 Dysphagia, unspecified: Secondary | ICD-10-CM

## 2018-04-30 HISTORY — DX: Gastro-esophageal reflux disease without esophagitis: K21.9

## 2018-04-30 MED ORDER — NA SULFATE-K SULFATE-MG SULF 17.5-3.13-1.6 GM/177ML PO SOLN: 1.0000 | ORAL | 0 refills | Status: DC

## 2018-04-30 MED ORDER — LUBIPROSTONE 24 MCG PO CAPS: ORAL_CAPSULE | ORAL | 0 refills | Status: DC

## 2018-04-30 NOTE — Assessment & Plan Note (Signed)
Due for surveillance colonoscopy for history of adenomatous colon polyps.  I have discussed the risks, alternatives, benefits with regards to but not limited to the risk of reaction to medication, bleeding, infection, perforation and the patient is agreeable to proceed. Written consent to be obtained.

## 2018-04-30 NOTE — Patient Instructions (Signed)
1. Upper endoscopy and colonoscopy as scheduled. 2. Start Amitiza 24 mcg, 1 with breakfast every morning, may take additional dose with evening meal for constipation.  Stop MiraLAX and Colace while on Amitiza.  Call for prescription if helpful. 3. Regarding reflux, you may continue using Tums as needed.  If you are having reflux more than 3 times per week, you should consider taking preventative medication daily.  Please let me know and we can send in a prescription.   Gastroesophageal Reflux Disease, Adult Normally, food travels down the esophagus and stays in the stomach to be digested. However, when a person has gastroesophageal reflux disease (GERD), food and stomach acid move back up into the esophagus. When this happens, the esophagus becomes sore and inflamed. Over time, GERD can create small holes (ulcers) in the lining of the esophagus. What are the causes? This condition is caused by a problem with the muscle between the esophagus and the stomach (lower esophageal sphincter, or LES). Normally, the LES muscle closes after food passes through the esophagus to the stomach. When the LES is weakened or abnormal, it does not close properly, and that allows food and stomach acid to go back up into the esophagus. The LES can be weakened by certain dietary substances, medicines, and medical conditions, including:  Tobacco use.  Pregnancy.  Having a hiatal hernia.  Heavy alcohol use.  Certain foods and beverages, such as coffee, chocolate, onions, and peppermint.  What increases the risk? This condition is more likely to develop in:  People who have an increased body weight.  People who have connective tissue disorders.  People who use NSAID medicines.  What are the signs or symptoms? Symptoms of this condition include:  Heartburn.  Difficult or painful swallowing.  The feeling of having a lump in the throat.  Abitter taste in the mouth.  Bad breath.  Having a large amount  of saliva.  Having an upset or bloated stomach.  Belching.  Chest pain.  Shortness of breath or wheezing.  Ongoing (chronic) cough or a night-time cough.  Wearing away of tooth enamel.  Weight loss.  Different conditions can cause chest pain. Make sure to see your health care provider if you experience chest pain. How is this diagnosed? Your health care provider will take a medical history and perform a physical exam. To determine if you have mild or severe GERD, your health care provider may also monitor how you respond to treatment. You may also have other tests, including:  An endoscopy toexamine your stomach and esophagus with a small camera.  A test thatmeasures the acidity level in your esophagus.  A test thatmeasures how much pressure is on your esophagus.  A barium swallow or modified barium swallow to show the shape, size, and functioning of your esophagus.  How is this treated? The goal of treatment is to help relieve your symptoms and to prevent complications. Treatment for this condition may vary depending on how severe your symptoms are. Your health care provider may recommend:  Changes to your diet.  Medicine.  Surgery.  Follow these instructions at home: Diet  Follow a diet as recommended by your health care provider. This may involve avoiding foods and drinks such as: ? Coffee and tea (with or without caffeine). ? Drinks that containalcohol. ? Energy drinks and sports drinks. ? Carbonated drinks or sodas. ? Chocolate and cocoa. ? Peppermint and mint flavorings. ? Garlic and onions. ? Horseradish. ? Spicy and acidic foods, including peppers, chili  powder, curry powder, vinegar, hot sauces, and barbecue sauce. ? Citrus fruit juices and citrus fruits, such as oranges, lemons, and limes. ? Tomato-based foods, such as red sauce, chili, salsa, and pizza with red sauce. ? Fried and fatty foods, such as donuts, french fries, potato chips, and high-fat  dressings. ? High-fat meats, such as hot dogs and fatty cuts of red and white meats, such as rib eye steak, sausage, ham, and bacon. ? High-fat dairy items, such as whole milk, butter, and cream cheese.  Eat small, frequent meals instead of large meals.  Avoid drinking large amounts of liquid with your meals.  Avoid eating meals during the 2-3 hours before bedtime.  Avoid lying down right after you eat.  Do not exercise right after you eat. General instructions  Pay attention to any changes in your symptoms.  Take over-the-counter and prescription medicines only as told by your health care provider. Do not take aspirin, ibuprofen, or other NSAIDs unless your health care provider told you to do so.  Do not use any tobacco products, including cigarettes, chewing tobacco, and e-cigarettes. If you need help quitting, ask your health care provider.  Wear loose-fitting clothing. Do not wear anything tight around your waist that causes pressure on your abdomen.  Raise (elevate) the head of your bed 6 inches (15cm).  Try to reduce your stress, such as with yoga or meditation. If you need help reducing stress, ask your health care provider.  If you are overweight, reduce your weight to an amount that is healthy for you. Ask your health care provider for guidance about a safe weight loss goal.  Keep all follow-up visits as told by your health care provider. This is important. Contact a health care provider if:  You have new symptoms.  You have unexplained weight loss.  You have difficulty swallowing, or it hurts to swallow.  You have wheezing or a persistent cough.  Your symptoms do not improve with treatment.  You have a hoarse voice. Get help right away if:  You have pain in your arms, neck, jaw, teeth, or back.  You feel sweaty, dizzy, or light-headed.  You have chest pain or shortness of breath.  You vomit and your vomit looks like blood or coffee grounds.  You  faint.  Your stool is bloody or black.  You cannot swallow, drink, or eat. This information is not intended to replace advice given to you by your health care provider. Make sure you discuss any questions you have with your health care provider. Document Released: 08/02/2005 Document Revised: 03/22/2016 Document Reviewed: 02/17/2015 Elsevier Interactive Patient Education  [employer].

## 2018-04-30 NOTE — Progress Notes (Addendum)
Primary Care Physician:  Caren Macadam, MD  Primary Gastroenterologist:  Barney Drain, MD REVIEWED-NO ADDITIONAL RECOMMENDATIONS.  Chief Complaint  Patient presents with  . Gastroesophageal Reflux    c/o feeling acid in throat at night, "hard time catching breath at night at times".   . Consult    TCS-last done about 7 years ago    HPI:  Terry Powell is a 67 y.o. male here to schedule colonoscopy and upper endoscopy.  He was last seen in July 2018.  He is due for surveillance colonoscopy for history of adenomatous colon polyps.  He has a history of esophageal stricture status post dilation in 2012.  Having back problems. Having some referred pain to lower abdomen from back per back doctor. Going to have injection.   Chronic constipation, takes stool softner and miralx as needed.  Seems to work better if he does not take it every day his periods of rest.  Even though he is having a bowel movement every day and it is soft, he feels like it is incomplete.  No melena, brbpr.  Having increasing swallowing difficulties, particularly to solid foods.  Sometimes at night when he lays down he will feel acid coming up into his throat.  Currently taking Tums as needed.  Current Outpatient Medications  Medication Sig Dispense Refill  . aspirin EC 81 MG tablet Take 1 tablet (81 mg total) by mouth daily.    Marland Kitchen atorvastatin (LIPITOR) 80 MG tablet Take 1 tablet (80 mg total) by mouth every evening. 90 tablet 3  . calcium carbonate (TUMS - DOSED IN MG ELEMENTAL CALCIUM) 500 MG chewable tablet Chew 1 tablet by mouth as needed for indigestion or heartburn.    . Colchicine (MITIGARE) 0.6 MG CAPS Take 2 pills at the first sign of gout.  Repeat dose at one hour Then one a day until gone 30 capsule 11  . docusate sodium (COLACE) 100 MG capsule Take 300 mg by mouth at bedtime.    . fluorouracil (EFUDEX) 5 % cream as needed.     Marland Kitchen FLUoxetine (PROZAC) 20 MG capsule Take 20 mg by mouth daily.     Marland Kitchen losartan  (COZAAR) 50 MG tablet Take 1 tablet (50 mg total) by mouth daily. 90 tablet 3  . nitroGLYCERIN (NITROSTAT) 0.4 MG SL tablet Place 1 tablet (0.4 mg total) under the tongue every 5 (five) minutes as needed. 20 tablet 2    No current facility-administered medications for this visit.     Allergies as of 04/30/2018 - Review Complete 04/30/2018  Allergen Reaction Noted  . Contrast media [iodinated diagnostic agents] Shortness Of Breath and Other (See Comments) 05/22/2011    Past Medical History:  Diagnosis Date  . Allergy    IV dye, seasonal  . Anxiety   . Arthritis   . Blood transfusion without reported diagnosis   . Cataract   . Clotting disorder ([place])   . Depression    since back surgery  . GERD (gastroesophageal reflux disease) 04/30/2018  . Gout   . Hypercholesteremia   . Hypertension   . Myocardial infarction ([place])   . Solitary kidney, congenital     Past Surgical History:  Procedure Laterality Date  . ANKLE FRACTURE SURGERY    . BACK SURGERY    . COLONOSCOPY  06/08/2011   Dr. Oneida Alar: 2 sessile polyps removed from the descending colon.  One was lower adenoma.  Diverticular also seen.  Hemorrhoids.TCS recommended in 2017.   Marland Kitchen ESOPHAGEAL DILATION  06/08/2011  Procedure: ESOPHAGEAL DILATION;  Surgeon: Dorothyann Peng, MD;  Location: [facility];  Service: Endoscopy;;  . ESOPHAGOGASTRODUODENOSCOPY  06/08/2011   Dr. Oneida Alar: Leftward duodenum, esophageal stricture status post dilation, hiatal hernia, gastritis/duodenitis..  No H. pylori on path.  [place]   2-3 discs injured , fusion  . TONSILLECTOMY    . VASECTOMY      Family History  Problem Relation Age of Onset  . Ulcers Father 50       partial gastrectomy  . Heart disease Father 50  . Arthritis Father   . Hyperlipidemia Father   . Ulcers Mother        stomach  . Arthritis Mother   . Asthma Mother   . COPD Mother   . Depression Mother   . Diabetes Mother   . Hyperlipidemia Mother   . Hypertension  Mother   . Kidney disease Mother   . Colon cancer Maternal Grandfather 72  . Stroke Maternal Grandfather   . Cancer Maternal Grandfather 21       colon  . Stroke Paternal Grandfather   . Liver disease Neg Hx     Social History   Socioeconomic History  . Marital status: Divorced    Spouse name: Not on file  . Number of children: 2  . Years of education: 63  . Highest education level: Not on file  Occupational History  . Occupation: retired    Comment: state trooper  Social Needs  . Financial resource strain: Not on file  . Food insecurity:    Worry: Not on file    Inability: Not on file  . Transportation needs:    Medical: Not on file    Non-medical: Not on file  Tobacco Use  . Smoking status: Never Smoker  . Smokeless tobacco: Never Used  Substance and Sexual Activity  . Alcohol use: No  . Drug use: No  . Sexual activity: Yes    Birth control/protection: Surgical  Lifestyle  . Physical activity:    Days per week: Not on file    Minutes per session: Not on file  . Stress: Not on file  Relationships  . Social connections:    Talks on phone: Not on file    Gets together: Not on file    Attends religious service: Not on file    Active member of club or organization: Not on file    Attends meetings of clubs or organizations: Not on file    Relationship status: Not on file  . Intimate partner violence:    Fear of current or ex partner: Not on file    Emotionally abused: Not on file    Physically abused: Not on file    Forced sexual activity: Not on file  Other Topics Concern  . Not on file  Social History Narrative   Retired [employer] alone   2 daughters - 2 grandsons   [place] acres   Works on [employer]      ROS:  General: Negative for anorexia, weight loss, fever, chills, fatigue, weakness. Eyes: Negative for vision changes.  ENT: Negative for hoarseness, difficulty swallowing , nasal congestion. CV: Negative for chest pain, angina,  palpitations, dyspnea on exertion, peripheral edema.  Respiratory: Negative for dyspnea at rest, dyspnea on exertion, cough, sputum, wheezing.  GI: See history of present illness. GU:  Negative for dysuria, hematuria, urinary incontinence, urinary frequency, nocturnal urination.  MS: Negative for joint  pain, +++low back pain.  Derm: Negative for rash or itching.  Neuro: Negative for weakness, abnormal sensation, seizure, frequent headaches, memory loss, confusion.  Psych: Negative for anxiety, depression, suicidal ideation, hallucinations.  Endo: Negative for unusual weight change.  Heme: Negative for bruising or bleeding. Allergy: Negative for rash or hives.    Physical Examination:  BP 126/87   Pulse (!) 52   Temp (!) 97 F (36.1 C) (Oral)   Ht 5\' 9"  (1.753 m)   Wt 169 lb 6.4 oz (76.8 kg)   BMI 25.02 kg/m    General: Well-nourished, well-developed in no acute distress.  Head: Normocephalic, atraumatic.   Eyes: Conjunctiva pink, no icterus. Mouth: Oropharyngeal mucosa moist and pink , no lesions erythema or exudate. Neck: Supple without thyromegaly, masses, or lymphadenopathy.  Lungs: Clear to auscultation bilaterally.  Heart: Regular rate and rhythm, no murmurs rubs or gallops.  Abdomen: Bowel sounds are normal, nontender, nondistended, no hepatosplenomegaly or masses, no abdominal bruits or    hernia , no rebound or guarding.   Rectal: deferred Extremities: No lower extremity edema. No clubbing or deformities.  Neuro: Alert and oriented x 4 , grossly normal neurologically.  Skin: Warm and dry, no rash or jaundice.   Psych: Alert and cooperative, normal mood and affect.  Labs: Lab Results  Component Value Date   WBC 6.3 06/08/2017   HGB 17.1 06/08/2017   HCT 48.2 06/08/2017   MCV 85 06/08/2017   PLT 198 06/08/2017   Lab Results  Component Value Date   CREATININE 1.12 06/08/2017   BUN 13 06/08/2017   NA 143 06/08/2017   K 4.5 06/08/2017   CL 103 06/08/2017   CO2  22 06/08/2017   Lab Results  Component Value Date   ALT 23 06/08/2017   AST 24 06/08/2017   ALKPHOS 88 06/08/2017   BILITOT 0.5 06/08/2017    Imaging Studies: No results found.

## 2018-04-30 NOTE — Assessment & Plan Note (Signed)
Incomplete bowel movements on Colace/MiraLAX.  Trial of amities a 24 mcg with food 1-2 times daily.  Samples provided.  He will call for prescription if he wants to continue.

## 2018-04-30 NOTE — Progress Notes (Signed)
CC'D TO PCP °

## 2018-04-30 NOTE — Assessment & Plan Note (Signed)
Some nocturnal reflux as well as progressive esophageal dysphagia to solid foods.  History of remote esophageal stricture.  Plan for upper endoscopy with dilation in the near future with Dr. Oneida Alar.  Patient will continue TUMS for now but anitcipate he will need PPI after EGD based on findings.  I have discussed the risks, alternatives, benefits with regards to but not limited to the risk of reaction to medication, bleeding, infection, perforation and the patient is agreeable to proceed. Written consent to be obtained.

## 2018-05-03 DIAGNOSIS — H6123 Impacted cerumen, bilateral: Secondary | ICD-10-CM | POA: Diagnosis not present

## 2018-05-03 DIAGNOSIS — H9192 Unspecified hearing loss, left ear: Secondary | ICD-10-CM | POA: Diagnosis not present

## 2018-05-21 DIAGNOSIS — N400 Enlarged prostate without lower urinary tract symptoms: Secondary | ICD-10-CM | POA: Diagnosis not present

## 2018-05-21 DIAGNOSIS — Z6825 Body mass index (BMI) 25.0-25.9, adult: Secondary | ICD-10-CM | POA: Diagnosis not present

## 2018-05-21 DIAGNOSIS — Z0000 Encounter for general adult medical examination without abnormal findings: Secondary | ICD-10-CM | POA: Diagnosis not present

## 2018-05-21 DIAGNOSIS — Z7189 Other specified counseling: Secondary | ICD-10-CM | POA: Diagnosis not present

## 2018-05-21 DIAGNOSIS — Z125 Encounter for screening for malignant neoplasm of prostate: Secondary | ICD-10-CM | POA: Diagnosis not present

## 2018-05-21 DIAGNOSIS — Z8639 Personal history of other endocrine, nutritional and metabolic disease: Secondary | ICD-10-CM | POA: Diagnosis not present

## 2018-05-21 DIAGNOSIS — Z1339 Encounter for screening examination for other mental health and behavioral disorders: Secondary | ICD-10-CM | POA: Diagnosis not present

## 2018-05-21 DIAGNOSIS — Z1211 Encounter for screening for malignant neoplasm of colon: Secondary | ICD-10-CM | POA: Diagnosis not present

## 2018-05-21 DIAGNOSIS — Z1331 Encounter for screening for depression: Secondary | ICD-10-CM | POA: Diagnosis not present

## 2018-05-21 DIAGNOSIS — Z299 Encounter for prophylactic measures, unspecified: Secondary | ICD-10-CM | POA: Diagnosis not present

## 2018-05-21 DIAGNOSIS — F329 Major depressive disorder, single episode, unspecified: Secondary | ICD-10-CM | POA: Diagnosis not present

## 2018-05-21 DIAGNOSIS — I10 Essential (primary) hypertension: Secondary | ICD-10-CM | POA: Diagnosis not present

## 2018-06-03 ENCOUNTER — Telehealth: Payer: Self-pay

## 2018-06-03 NOTE — Telephone Encounter (Signed)
Tried to call pt, no answer, LMOVM for return call.  

## 2018-06-03 NOTE — Telephone Encounter (Signed)
Pt needs to R/s his TCS for this Friday. Pt is having back problems. Contact number [phone]

## 2018-06-04 NOTE — Telephone Encounter (Signed)
Tried to call pt, no answer, LMOVM to return call to reschedule procedure and procedure for 06/07/18 would be cancelled. Informed endo scheduler to cancel procedure for 06/07/18.

## 2018-06-06 NOTE — Telephone Encounter (Signed)
Letter mailed

## 2018-06-07 ENCOUNTER — Encounter (HOSPITAL_COMMUNITY): Admission: RE | Payer: Self-pay | Source: Ambulatory Visit

## 2018-06-07 ENCOUNTER — Ambulatory Visit (HOSPITAL_COMMUNITY): Admission: RE | Admit: 2018-06-07 | Payer: Medicare Other | Source: Ambulatory Visit | Admitting: Gastroenterology

## 2018-06-07 SURGERY — COLONOSCOPY
Anesthesia: Moderate Sedation

## 2018-06-10 ENCOUNTER — Other Ambulatory Visit: Payer: Self-pay | Admitting: Cardiovascular Disease

## 2018-06-10 MED ORDER — LOSARTAN POTASSIUM 50 MG PO TABS: 50.0000 mg | ORAL_TABLET | Freq: Every day | ORAL | 0 refills | Status: DC

## 2018-06-10 MED ORDER — ATORVASTATIN CALCIUM 80 MG PO TABS: 80.0000 mg | ORAL_TABLET | Freq: Every evening | ORAL | 0 refills | Status: DC

## 2018-06-10 NOTE — Telephone Encounter (Signed)
Pt's medications was sent to pt's pharmacy as requested. Confirmation received.  

## 2018-06-21 ENCOUNTER — Telehealth: Payer: Self-pay | Admitting: Gastroenterology

## 2018-06-21 NOTE — Telephone Encounter (Signed)
[phone] PATIENT RECEIVED LETTER TO SCHEDULE HIS TCS

## 2018-06-24 ENCOUNTER — Other Ambulatory Visit: Payer: Self-pay

## 2018-06-24 DIAGNOSIS — Z86010 Personal history of colonic polyps: Secondary | ICD-10-CM

## 2018-06-24 DIAGNOSIS — K219 Gastro-esophageal reflux disease without esophagitis: Secondary | ICD-10-CM

## 2018-06-24 DIAGNOSIS — R1319 Other dysphagia: Secondary | ICD-10-CM

## 2018-06-24 DIAGNOSIS — R1310 Dysphagia, unspecified: Secondary | ICD-10-CM

## 2018-06-24 MED ORDER — NA SULFATE-K SULFATE-MG SULF 17.5-3.13-1.6 GM/177ML PO SOLN: 1.0000 | ORAL | 0 refills | Status: DC

## 2018-06-24 NOTE — Telephone Encounter (Signed)
Called pt, TCS/EGD/DIL w/SLF rescheduled to 08/23/18 at 12:00pm. He asked for prep rx be resent to pharmacy. Rx sent. New instructions mailed. Orders entered.

## 2018-06-24 NOTE — Addendum Note (Signed)
Addended by: Zara Council C on: 06/24/2018 11:12 AM   Modules accepted: Orders

## 2018-07-09 ENCOUNTER — Encounter: Payer: Self-pay | Admitting: Cardiovascular Disease

## 2018-07-16 ENCOUNTER — Ambulatory Visit: Payer: Medicare Other | Admitting: Family Medicine

## 2018-08-16 DIAGNOSIS — L738 Other specified follicular disorders: Secondary | ICD-10-CM | POA: Diagnosis not present

## 2018-08-16 DIAGNOSIS — L918 Other hypertrophic disorders of the skin: Secondary | ICD-10-CM | POA: Diagnosis not present

## 2018-08-16 DIAGNOSIS — L570 Actinic keratosis: Secondary | ICD-10-CM | POA: Diagnosis not present

## 2018-08-16 DIAGNOSIS — L821 Other seborrheic keratosis: Secondary | ICD-10-CM | POA: Diagnosis not present

## 2018-08-22 ENCOUNTER — Ambulatory Visit: Payer: Medicare Other | Admitting: Cardiovascular Disease

## 2018-08-23 ENCOUNTER — Other Ambulatory Visit: Payer: Self-pay

## 2018-08-23 ENCOUNTER — Encounter (HOSPITAL_COMMUNITY): Payer: Self-pay

## 2018-08-23 ENCOUNTER — Ambulatory Visit (HOSPITAL_COMMUNITY)
Admission: RE | Admit: 2018-08-23 | Discharge: 2018-08-23 | Disposition: A | Discharge status: Home / Self Care | Payer: Medicare Other | Source: Ambulatory Visit | Attending: Gastroenterology | Admitting: Gastroenterology

## 2018-08-23 ENCOUNTER — Encounter (HOSPITAL_COMMUNITY)
Admission: RE | Disposition: A | Discharge status: Home / Self Care | Payer: Self-pay | Source: Ambulatory Visit | Attending: Gastroenterology

## 2018-08-23 DIAGNOSIS — K648 Other hemorrhoids: Secondary | ICD-10-CM | POA: Insufficient documentation

## 2018-08-23 DIAGNOSIS — F329 Major depressive disorder, single episode, unspecified: Secondary | ICD-10-CM | POA: Insufficient documentation

## 2018-08-23 DIAGNOSIS — K635 Polyp of colon: Secondary | ICD-10-CM | POA: Diagnosis not present

## 2018-08-23 DIAGNOSIS — D128 Benign neoplasm of rectum: Secondary | ICD-10-CM | POA: Diagnosis not present

## 2018-08-23 DIAGNOSIS — Z791 Long term (current) use of non-steroidal anti-inflammatories (NSAID): Secondary | ICD-10-CM | POA: Diagnosis not present

## 2018-08-23 DIAGNOSIS — K222 Esophageal obstruction: Secondary | ICD-10-CM | POA: Diagnosis not present

## 2018-08-23 DIAGNOSIS — M109 Gout, unspecified: Secondary | ICD-10-CM | POA: Diagnosis not present

## 2018-08-23 DIAGNOSIS — I10 Essential (primary) hypertension: Secondary | ICD-10-CM | POA: Diagnosis not present

## 2018-08-23 DIAGNOSIS — Z79899 Other long term (current) drug therapy: Secondary | ICD-10-CM | POA: Insufficient documentation

## 2018-08-23 DIAGNOSIS — K5730 Diverticulosis of large intestine without perforation or abscess without bleeding: Secondary | ICD-10-CM | POA: Insufficient documentation

## 2018-08-23 DIAGNOSIS — Z86010 Personal history of colonic polyps: Secondary | ICD-10-CM | POA: Insufficient documentation

## 2018-08-23 DIAGNOSIS — Z8249 Family history of ischemic heart disease and other diseases of the circulatory system: Secondary | ICD-10-CM | POA: Insufficient documentation

## 2018-08-23 DIAGNOSIS — D122 Benign neoplasm of ascending colon: Secondary | ICD-10-CM | POA: Diagnosis not present

## 2018-08-23 DIAGNOSIS — K2980 Duodenitis without bleeding: Secondary | ICD-10-CM | POA: Diagnosis not present

## 2018-08-23 DIAGNOSIS — F419 Anxiety disorder, unspecified: Secondary | ICD-10-CM | POA: Insufficient documentation

## 2018-08-23 DIAGNOSIS — D124 Benign neoplasm of descending colon: Secondary | ICD-10-CM | POA: Insufficient documentation

## 2018-08-23 DIAGNOSIS — K449 Diaphragmatic hernia without obstruction or gangrene: Secondary | ICD-10-CM | POA: Diagnosis not present

## 2018-08-23 DIAGNOSIS — M1990 Unspecified osteoarthritis, unspecified site: Secondary | ICD-10-CM | POA: Insufficient documentation

## 2018-08-23 DIAGNOSIS — Z91041 Radiographic dye allergy status: Secondary | ICD-10-CM | POA: Insufficient documentation

## 2018-08-23 DIAGNOSIS — K219 Gastro-esophageal reflux disease without esophagitis: Secondary | ICD-10-CM | POA: Diagnosis not present

## 2018-08-23 DIAGNOSIS — R1319 Other dysphagia: Secondary | ICD-10-CM

## 2018-08-23 DIAGNOSIS — I252 Old myocardial infarction: Secondary | ICD-10-CM | POA: Diagnosis not present

## 2018-08-23 DIAGNOSIS — Q600 Renal agenesis, unilateral: Secondary | ICD-10-CM | POA: Diagnosis not present

## 2018-08-23 DIAGNOSIS — Z7982 Long term (current) use of aspirin: Secondary | ICD-10-CM | POA: Diagnosis not present

## 2018-08-23 DIAGNOSIS — Z1211 Encounter for screening for malignant neoplasm of colon: Secondary | ICD-10-CM | POA: Insufficient documentation

## 2018-08-23 DIAGNOSIS — K2970 Gastritis, unspecified, without bleeding: Secondary | ICD-10-CM

## 2018-08-23 DIAGNOSIS — R1310 Dysphagia, unspecified: Secondary | ICD-10-CM | POA: Diagnosis not present

## 2018-08-23 DIAGNOSIS — Z800 Family history of malignant neoplasm of digestive organs: Secondary | ICD-10-CM | POA: Insufficient documentation

## 2018-08-23 DIAGNOSIS — E7800 Pure hypercholesterolemia, unspecified: Secondary | ICD-10-CM | POA: Insufficient documentation

## 2018-08-23 DIAGNOSIS — K3189 Other diseases of stomach and duodenum: Secondary | ICD-10-CM | POA: Diagnosis not present

## 2018-08-23 HISTORY — PX: COLONOSCOPY: SHX5424

## 2018-08-23 HISTORY — PX: SAVORY DILATION: SHX5439

## 2018-08-23 HISTORY — PX: ESOPHAGOGASTRODUODENOSCOPY: SHX5428

## 2018-08-23 HISTORY — PX: BIOPSY: SHX5522

## 2018-08-23 HISTORY — PX: POLYPECTOMY: SHX5525

## 2018-08-23 SURGERY — COLONOSCOPY
Anesthesia: Moderate Sedation

## 2018-08-23 MED ORDER — MIDAZOLAM HCL 5 MG/5ML IJ SOLN
INTRAMUSCULAR | Status: DC | PRN
  Administered 2018-08-23: 1 mg via INTRAVENOUS
  Administered 2018-08-23 (×2): 2 mg via INTRAVENOUS

## 2018-08-23 MED ORDER — MEPERIDINE HCL 100 MG/ML IJ SOLN
INTRAMUSCULAR | Status: DC | PRN
  Administered 2018-08-23 (×2): 25 mg via INTRAVENOUS
  Administered 2018-08-23: 50 mg via INTRAVENOUS

## 2018-08-23 MED ORDER — SODIUM CHLORIDE 0.9 % IV SOLN
INTRAVENOUS | Status: DC
  Administered 2018-08-23: 11:00:00 via INTRAVENOUS

## 2018-08-23 MED ORDER — MINERAL OIL PO OIL
TOPICAL_OIL | ORAL | Status: AC
  Filled 2018-08-23: qty 30

## 2018-08-23 MED ORDER — LIDOCAINE VISCOUS HCL 2 % MT SOLN
OROMUCOSAL | Status: AC
  Filled 2018-08-23: qty 15

## 2018-08-23 MED ORDER — STERILE WATER FOR IRRIGATION IR SOLN
Status: DC | PRN
  Administered 2018-08-23: 100 mL

## 2018-08-23 MED ORDER — MIDAZOLAM HCL 5 MG/5ML IJ SOLN
INTRAMUSCULAR | Status: AC
  Filled 2018-08-23: qty 10

## 2018-08-23 MED ORDER — PANTOPRAZOLE SODIUM 40 MG PO TBEC: DELAYED_RELEASE_TABLET | ORAL | 11 refills | Status: DC

## 2018-08-23 MED ORDER — PROMETHAZINE HCL 25 MG/ML IJ SOLN
INTRAMUSCULAR | Status: DC | PRN
  Administered 2018-08-23: 12.5 mg via INTRAVENOUS

## 2018-08-23 MED ORDER — MEPERIDINE HCL 100 MG/ML IJ SOLN
INTRAMUSCULAR | Status: AC
  Filled 2018-08-23: qty 2

## 2018-08-23 MED ORDER — PROMETHAZINE HCL 25 MG/ML IJ SOLN
INTRAMUSCULAR | Status: AC
  Filled 2018-08-23: qty 1

## 2018-08-23 NOTE — Op Note (Signed)
[facility] Patient Name: Terry Powell Procedure Date: 08/23/2018 11:10 AM MRN: [id number] Date of Birth: 1951/08/31 Attending MD: Barney Drain MD, MD CSN: [id number] Age: 68 Admit Type: Outpatient Procedure:                Colonoscopy WITH COLD SNARE/SNARE CAUTERY                            POLYPECTOMY Indications:              Personal history of colonic polyps Providers:                Barney Drain MD, MD, Rosina Lowenstein, RN, Aram Candela Referring MD:             Caren Macadam Medicines:                Promethazine 12.5 mg IV, Meperidine 75 mg IV,                            Midazolam 5 mg IV Complications:            No immediate complications. Estimated Blood Loss:     Estimated blood loss was minimal. Procedure:                Pre-Anesthesia Assessment:                           - Prior to the procedure, a History and Physical                            was performed, and patient medications and                            allergies were reviewed. The patient's tolerance of                            previous anesthesia was also reviewed. The risks                            and benefits of the procedure and the sedation                            options and risks were discussed with the patient.                            All questions were answered, and informed consent                            was obtained. Prior Anticoagulants: The patient has                            taken aspirin, last dose was 1 day prior to                            procedure. ASA Grade Assessment: II - A patient  with mild systemic disease. After reviewing the                            risks and benefits, the patient was deemed in                            satisfactory condition to undergo the procedure.                            After obtaining informed consent, the colonoscope                            was passed under direct vision. Throughout the                 procedure, the patient's blood pressure, pulse, and                            oxygen saturations were monitored continuously. The                            CF-HQ190L ([id number]) scope was introduced through                            the anus and advanced to the the cecum, identified                            by appendiceal orifice and ileocecal valve. The                            colonoscopy was technically difficult and complex                            due to a tortuous colon and the patient's                            agitation. Successful completion of the procedure                            was aided by increasing the dose of sedation                            medication, straightening and shortening the scope                            to obtain bowel loop reduction and COLOWRAP. The                            patient tolerated the procedure fairly well. The                            quality of the bowel preparation was good. The  ileocecal valve, appendiceal orifice, and rectum                            were photographed. Scope In: 11:47:05 AM Scope Out: 12:10:10 PM Scope Withdrawal Time: 0 hours 19 minutes 36 seconds  Total Procedure Duration: 0 hours 23 minutes 5 seconds  Findings:      A 8 mm polyp was found in the rectum. The polyp was sessile. The polyp       was removed with a hot snare. Resection and retrieval were complete.      Three sessile polyps were found in the descending colon and ascending       colon. The polyps were 4 to 6 mm in size. These polyps were removed with       a cold snare. Resection and retrieval were complete.      Multiple small and large-mouthed diverticula were found in the       recto-sigmoid colon, sigmoid colon, ascending colon and cecum.      Internal hemorrhoids were found. The hemorrhoids were small.      The recto-sigmoid colon, sigmoid colon and descending colon revealed       significantly  excessive looping. Impression:               - One 8 mm INFLAMMATORY APPEARING polyp in the                            rectum, removed with a hot snare. Resected and                            retrieved.                           - Three 4 to 6 mm polyps in the descending colon(2)                            and in the ascending colon, removed with a cold                            snare. Resected and retrieved.                           - Diverticulosis in the recto-sigmoid colon, in the                            sigmoid colon, in the ascending colon and in the                            cecum.                           - Internal hemorrhoids.                           - There was significant looping of the colon. Moderate Sedation:      Moderate (conscious) sedation was administered by the endoscopy nurse       and supervised by the endoscopist. The following parameters were  monitored: oxygen saturation, heart rate, blood pressure, and response       to care. Total physician intraservice time was 61 minutes. Recommendation:           - Patient has a contact number available for                            emergencies. The signs and symptoms of potential                            delayed complications were discussed with the                            patient. Return to normal activities tomorrow.                            Written discharge instructions were provided to the                            patient.                           - High fiber diet.                           - Continue present medications.                           - Await pathology results.                           - Repeat colonoscopy in 3 - 5 years for                            surveillance.                           - Return to GI office in 4 months. Procedure Code(s):        --- Professional ---                           [phone], Colonoscopy, flexible; with removal of                             tumor(s), polyp(s), or other lesion(s) by snare                            technique                           99153, Moderate sedation; each additional 15                            minutes intraservice time                           99153, Moderate sedation; each additional 15  minutes intraservice time                           99153, Moderate sedation; each additional 15                            minutes intraservice time                           G0500, Moderate sedation services provided by the                            same physician or other qualified health care                            professional performing a gastrointestinal                            endoscopic service that sedation supports,                            requiring the presence of an independent trained                            observer to assist in the monitoring of the                            patient's level of consciousness and physiological                            status; initial 15 minutes of intra-service time;                            patient age 37 years or older (additional time may                            be reported with [phone], as appropriate) Diagnosis Code(s):        --- Professional ---                           K62.1, Rectal polyp                           D12.4, Benign neoplasm of descending colon                           D12.2, Benign neoplasm of ascending colon                           K64.8, Other hemorrhoids                           Z86.010, Personal history of colonic polyps                           K57.30, Diverticulosis of large intestine without  perforation or abscess without bleeding CPT copyright 2018 American Medical Association. All rights reserved. The codes documented in this report are preliminary and upon coder review may  be revised to meet current compliance requirements. Barney Drain, MD Barney Drain  MD, MD 08/23/2018 12:44:00 PM This report has been signed electronically. Number of Addenda: 0

## 2018-08-23 NOTE — Op Note (Signed)
[facility] Patient Name: Terry Powell Procedure Date: 08/23/2018 11:03 AM MRN: [id number] Date of Birth: 04-18-1951 Attending MD: Barney Drain MD, MD CSN: [id number] Age: 67 Admit Type: Outpatient Procedure:                Upper GI endoscopy WITH COLD FORCEPS                            BIOPSY/ESOPHAGEAL DILATION Indications:              Dysphagia Providers:                Barney Drain MD, MD, Rosina Lowenstein, RN, Aram Candela Referring MD:             Caren Macadam Medicines:                TCS + Meperidine 25 mg IV Complications:            No immediate complications. Estimated Blood Loss:     Estimated blood loss was minimal. Procedure:                Pre-Anesthesia Assessment:                           - Prior to the procedure, a History and Physical                            was performed, and patient medications and                            allergies were reviewed. The patient's tolerance of                            previous anesthesia was also reviewed. The risks                            and benefits of the procedure and the sedation                            options and risks were discussed with the patient.                            All questions were answered, and informed consent                            was obtained. Prior Anticoagulants: The patient has                            taken aspirin, last dose was 1 day prior to                            procedure. ASA Grade Assessment: II - A patient                            with mild systemic disease. After reviewing the  risks and benefits, the patient was deemed in                            satisfactory condition to undergo the procedure.                            After obtaining informed consent, the endoscope was                            passed under direct vision. Throughout the                            procedure, the patient's blood pressure, pulse, and              oxygen saturations were monitored continuously. The                            [id number]-H190 ([id number]) scope was introduced through the                            mouth, and advanced to the second part of duodenum.                            The upper GI endoscopy was somewhat difficult due                            to the patient's agitation. Successful completion                            of the procedure was aided by straightening and                            shortening the scope to obtain bowel loop                            reduction. The patient tolerated the procedure                            fairly well. Scope In: 63:01:60 PM Scope Out: 12:29:43 PM Total Procedure Duration: 0 hours 13 minutes 25 seconds  Findings:      One benign-appearing, intrinsic moderate (circumferential scarring or       stenosis; an endoscope may pass) stenosis was found. This stenosis       measured 1.2 cm (inner diameter). The stenosis was traversed. A       guidewire was placed and the scope was withdrawn. Dilation was performed       with a Savary dilator with mild resistance at 12.8 mm and moderate       resistance at 14 mm, 15 mm and 16 mm. Estimated blood loss was minimal.      Patchy mild inflammation characterized by congestion (edema) and       erythema was found in the entire examined stomach. Biopsies were taken       with a cold forceps for Helicobacter pylori testing.      A small hiatal  hernia was present.      Patchy mild inflammation characterized by congestion (edema) and       erythema was found in the entire duodenum. Impression:               - Benign-appearing esophageal STRICTURE DUE TO                            UNCONTROLLED GERD. Dilated.                           - Gastritis/DUODENITIS DUE TO ASA.NSAIDS. Biopsied.                           - Small hiatal hernia. Moderate Sedation:      Moderate (conscious) sedation was administered by the endoscopy nurse       and  supervised by the endoscopist. The following parameters were       monitored: oxygen saturation, heart rate, blood pressure, and response       to care. Total physician intraservice time was 61 minutes. Recommendation:           - Patient has a contact number available for                            emergencies. The signs and symptoms of potential                            delayed complications were discussed with the                            patient. Return to normal activities tomorrow.                            Written discharge instructions were provided to the                            patient.                           - High fiber diet and low fat diet.                           - Continue present medications. ADD PROTONIX DAILY.                           - Await pathology results.                           - Return to my office in 4 months. Procedure Code(s):        --- Professional ---                           [phone], Esophagogastroduodenoscopy, flexible,                            transoral; with insertion of guide wire followed by  passage of dilator(s) through esophagus over guide                            wire                           43239, 59, Esophagogastroduodenoscopy, flexible,                            transoral; with biopsy, single or multiple                           99153, Moderate sedation; each additional 15                            minutes intraservice time                           99153, Moderate sedation; each additional 15                            minutes intraservice time                           99153, Moderate sedation; each additional 15                            minutes intraservice time                           G0500, Moderate sedation services provided by the                            same physician or other qualified health care                            professional performing a gastrointestinal                             endoscopic service that sedation supports,                            requiring the presence of an independent trained                            observer to assist in the monitoring of the                            patient's level of consciousness and physiological                            status; initial 15 minutes of intra-service time;                            patient age 54 years or older (additional time 68  be reported with [phone], as appropriate) Diagnosis Code(s):        --- Professional ---                           K22.2, Esophageal obstruction                           K29.70, Gastritis, unspecified, without bleeding                           K44.9, Diaphragmatic hernia without obstruction or                            gangrene                           K29.80, Duodenitis without bleeding                           R13.10, Dysphagia, unspecified CPT copyright 2018 American Medical Association. All rights reserved. The codes documented in this report are preliminary and upon coder review may  be revised to meet current compliance requirements. Barney Drain, MD Barney Drain MD, MD 08/23/2018 12:49:43 PM This report has been signed electronically. Number of Addenda: 0

## 2018-08-23 NOTE — H&P (Signed)
Primary Care Physician:  Arsenio Katz, NP Primary Gastroenterologist:  Dr. Oneida Alar  Pre-Procedure History & Physical: HPI:  Terry Powell is a 67 y.o. male here for PERSONAL HISTORY OF POLYPS/dysphagia.  Past Medical History:  Diagnosis Date  . Allergy    IV dye, seasonal  . Anxiety   . Arthritis   . Blood transfusion without reported diagnosis   . Cataract   . Clotting disorder ([place])   . Depression    since back surgery  . GERD (gastroesophageal reflux disease) 04/30/2018  . Gout   . Hypercholesteremia   . Hypertension   . Myocardial infarction ([place])   . Solitary kidney, congenital     Past Surgical History:  Procedure Laterality Date  . ANKLE FRACTURE SURGERY    . BACK SURGERY    . COLONOSCOPY  06/08/2011   Dr. Oneida Alar: 2 sessile polyps removed from the descending colon.  One was lower adenoma.  Diverticular also seen.  Hemorrhoids.TCS recommended in 2017.   Marland Kitchen ESOPHAGEAL DILATION  06/08/2011   Procedure: ESOPHAGEAL DILATION;  Surgeon: Dorothyann Peng, MD;  Location: [facility];  Service: Endoscopy;;  . ESOPHAGOGASTRODUODENOSCOPY  06/08/2011   Dr. Oneida Alar: Leftward duodenum, esophageal stricture status post dilation, hiatal hernia, gastritis/duodenitis..  No H. pylori on path.  [place]   2-3 discs injured , fusion  . TONSILLECTOMY    . VASECTOMY      Prior to Admission medications   Medication Sig Start Date End Date Taking? Authorizing Provider  aspirin EC 81 MG tablet Take 1 tablet (81 mg total) by mouth daily. 07/26/11  Yes Larey Dresser, MD  atorvastatin (LIPITOR) 80 MG tablet Take 1 tablet (80 mg total) by mouth every evening. Please keep upcoming appt in October with Dr. Johnsie Cancel. Thank you 06/10/18  Yes Josue Hector, MD  calcium carbonate (TUMS - DOSED IN MG ELEMENTAL CALCIUM) 500 MG chewable tablet Chew 1 tablet by mouth as needed for indigestion or heartburn.   Yes [provider]  cholecalciferol (VITAMIN D) 1000 units tablet Take 1,000  Units by mouth daily.   Yes [provider]  Colchicine (MITIGARE) 0.6 MG CAPS Take 2 pills at the first sign of gout.  Repeat dose at one hour Then one a day until gone Patient taking differently: Take 2 tablets by mouth See admin instructions. Take 2 pills at the first sign of gout.  Repeat dose at one hour Then one a day until gone 01/11/18  Yes Meda Coffee Jennette Banker, MD  docusate sodium (COLACE) 100 MG capsule Take 300 mg by mouth daily as needed for moderate constipation.    Yes [provider]  FLUoxetine (PROZAC) 20 MG capsule Take 20 mg by mouth daily.  05/17/11  Yes [provider]  losartan (COZAAR) 50 MG tablet Take 1 tablet (50 mg total) by mouth daily. Please keep upcoming appt in October with Dr. Johnsie Cancel. Thank you 06/10/18  Yes Josue Hector, MD  polyethylene glycol (MIRALAX / GLYCOLAX) packet Take 17 g by mouth daily.   Yes [provider]  nitroGLYCERIN (NITROSTAT) 0.4 MG SL tablet Place 1 tablet (0.4 mg total) under the tongue every 5 (five) minutes as needed. 06/15/16   Isaiah Serge, NP    Allergies as of 06/24/2018 - Review Complete 05/31/2018  Allergen Reaction Noted  . Contrast media [iodinated diagnostic agents] Shortness Of Breath and Other (See Comments) 05/22/2011    Family History  Problem Relation Age of Onset  .  Ulcers Father 58       partial gastrectomy  . Heart disease Father 49  . Arthritis Father   . Hyperlipidemia Father   . Ulcers Mother        stomach  . Arthritis Mother   . Asthma Mother   . COPD Mother   . Depression Mother   . Diabetes Mother   . Hyperlipidemia Mother   . Hypertension Mother   . Kidney disease Mother   . Colon cancer Maternal Grandfather 74  . Stroke Maternal Grandfather   . Cancer Maternal Grandfather 44       colon  . Stroke Paternal Grandfather   . Liver disease Neg Hx     Social History   Socioeconomic History  . Marital status: Divorced    Spouse name: Not on file  . Number of  children: 2  . Years of education: 55  . Highest education level: Not on file  Occupational History  . Occupation: retired    Comment: state trooper  Social Needs  . Financial resource strain: Not on file  . Food insecurity:    Worry: Not on file    Inability: Not on file  . Transportation needs:    Medical: Not on file    Non-medical: Not on file  Tobacco Use  . Smoking status: Never Smoker  . Smokeless tobacco: Never Used  Substance and Sexual Activity  . Alcohol use: No  . Drug use: No  . Sexual activity: Yes    Birth control/protection: Surgical  Lifestyle  . Physical activity:    Days per week: Not on file    Minutes per session: Not on file  . Stress: Not on file  Relationships  . Social connections:    Talks on phone: Not on file    Gets together: Not on file    Attends religious service: Not on file    Active member of club or organization: Not on file    Attends meetings of clubs or organizations: Not on file    Relationship status: Not on file  . Intimate partner violence:    Fear of current or ex partner: Not on file    Emotionally abused: Not on file    Physically abused: Not on file    Forced sexual activity: Not on file  Other Topics Concern  . Not on file  Social History Narrative   Retired Emergency planning/management officer   Lives alone   2 daughters - 2 grandsons   [place]   Works on [place]: See HPI, otherwise negative ROS   Physical Exam: BP (!) 172/97   Pulse (!) 46   Temp (!) 97.5 F (36.4 C) (Oral)   Resp (!) 23   Ht 5\' 9"  (1.753 m)   Wt 74.8 kg   SpO2 100%   BMI 24.37 kg/m  General:   Alert,  pleasant and cooperative in NAD Head:  Normocephalic and atraumatic. Neck:  Supple; Lungs:  Clear throughout to auscultation.    Heart:  Regular rate and rhythm. Abdomen:  Soft, nontender and nondistended. Normal bowel sounds, without guarding, and without rebound.   Neurologic:  Alert and  oriented x4;  grossly normal  neurologically.  Impression/Plan:     PERSONAL HISTORY OF POLYPS/dysphagia.  PLAN: 1. TCS TODAY. DISCUSSED PROCEDURE, BENEFITS, & RISKS: < 1% chance of medication reaction, bleeding, perforation, or rupture of spleen/liver.

## 2018-08-26 ENCOUNTER — Encounter: Payer: Self-pay | Admitting: Gastroenterology

## 2018-08-26 ENCOUNTER — Telehealth: Payer: Self-pay | Admitting: Gastroenterology

## 2018-08-26 NOTE — Telephone Encounter (Signed)
PLEASE CALL PT. He had ONE SIMPLE ADENOMA AND THREE polypoid lesions removed. His stomach Bx shows CHANGES FROM ACID REFLUX.    FOLLOW A HIGH FIBER/LOW FAT DIET. AVOID ITEMS THAT CAUSE BLOATING.   TO PREVENT STRICTURES DUE TO REFLUX AND ULCERS/GASTRITIS DUE TO ASPIRIN & IBUPROFEN USE, START PROTONIX. TAKE 30 MINUTES PRIOR TO YOUR FIRST MEAL DAILY forever.  FOLLOW UP IN 4 MOS.  Next colonoscopy in 5 years.

## 2018-08-26 NOTE — Telephone Encounter (Signed)
PT is aware.

## 2018-08-26 NOTE — Discharge Instructions (Signed)
You had 4 polyps removed. You have small internal hemorrhoids and diverticulosis IN YOUR RIGHT AND LEFT COLON. I dilated your esophagus DUE TO A STRICTURE, WHICH COMES FROM UNCONTROLLED ACID REFLUX. You have EROSIVE gastritis DUE TO ASPIRIN AND IBUPROFEN. I biopsied your UPPER stomach.   FOLLOW A HIGH FIBER/LOW FAT DIET. AVOID ITEMS THAT CAUSE BLOATING. SEE INFO BELOW.  TO PREVENT STRICTURES DUE TO REFLUX AND ULCERS/GASTRITIS DUE TO ASPIRIN & IBUPROFEN USE, START PROTONIX. TAKE 30 MINUTES PRIOR TO YOUR FIRST MEAL DAILY forever.  YOUR BIOPSY RESULTS WILL BE BACK IN 5 BUSINESS DAYS.  FOLLOW UP IN 4 MOS.  Next colonoscopy in 3-5 years.  ENDOSCOPY Care After Read the instructions outlined below and refer to this sheet in the next week. These discharge instructions provide you with general information on caring for yourself after you leave the hospital. While your treatment has been planned according to the most current medical practices available, unavoidable complications occasionally occur. If you have any problems or questions after discharge, call DR. Kaylyn Garrow, [phone].  ACTIVITY  You may resume your regular activity, but move at a slower pace for the next 24 hours.   Take frequent rest periods for the next 24 hours.   Walking will help get rid of the air and reduce the bloated feeling in your belly (abdomen).   No driving for 24 hours (because of the medicine (anesthesia) used during the test).   You may shower.   Do not sign any important legal documents or operate any machinery for 24 hours (because of the anesthesia used during the test).    NUTRITION  Drink plenty of fluids.   You may resume your normal diet as instructed by your doctor.   Begin with a light meal and progress to your normal diet. Heavy or fried foods are harder to digest and may make you feel sick to your stomach (nauseated).   Avoid alcoholic beverages for 24 hours or as instructed.     MEDICATIONS  You may resume your normal medications.   WHAT YOU CAN EXPECT TODAY  Some feelings of bloating in the abdomen.   Passage of more gas than usual.   Spotting of blood in your stool or on the toilet paper  .  IF YOU HAD POLYPS REMOVED DURING THE ENDOSCOPY:  Eat a soft diet IF YOU HAVE NAUSEA, BLOATING, ABDOMINAL PAIN, OR VOMITING.    FINDING OUT THE RESULTS OF YOUR TEST Not all test results are available during your visit. DR. Oneida Alar WILL CALL YOU WITHIN 14 DAYS OF YOUR PROCEDUE WITH YOUR RESULTS. Do not assume everything is normal if you have not heard from DR. Anajulia Leyendecker, CALL HER OFFICE AT [phone].  SEEK IMMEDIATE MEDICAL ATTENTION AND CALL THE OFFICE: [phone] IF:  You have more than a spotting of blood in your stool.   Your belly is swollen (abdominal distention).   You are nauseated or vomiting.   You have a temperature over 101F.   You have abdominal pain or discomfort that is severe or gets worse throughout the day.  Gastritis/DUODENITIS  Gastritis is an inflammation (the body's way of reacting to injury and/or infection) of the stomach. DUODENITIS is an inflammation (the body's way of reacting to injury and/or infection) of the FIRST PART OF THE SMALL INTESTINES. It is often caused by bacterial (germ) infections. It can also be caused BY ASPIRIN, BC/GOODY POWDER'S, (IBUPROFEN) MOTRIN, OR ALEVE (NAPROXEN), chemicals (including alcohol), SPICY FOODS, and medications. This illness may be associated with  generalized malaise (feeling tired, not well), UPPER ABDOMINAL STOMACH cramps, and fever. One common bacterial cause of gastritis is an organism known as H. Pylori. This can be treated with antibiotics.     High-Fiber Diet A high-fiber diet changes your normal diet to include more whole grains, legumes, fruits, and vegetables. Changes in the diet involve replacing refined carbohydrates with unrefined foods. The calorie level of the diet is essentially  unchanged. The Dietary Reference Intake (recommended amount) for adult males is 38 grams per day. For adult females, it is 25 grams per day. Pregnant and lactating women should consume 28 grams of fiber per day. Fiber is the intact part of a plant that is not broken down during digestion. Functional fiber is fiber that has been isolated from the plant to provide a beneficial effect in the body. PURPOSE  Increase stool bulk.   Ease and regulate bowel movements.   Lower cholesterol.  REDUCE RISK OF COLON CANCER  INDICATIONS THAT YOU NEED MORE FIBER  Constipation and hemorrhoids.   Uncomplicated diverticulosis (intestine condition) and irritable bowel syndrome.   Weight management.   As a protective measure against hardening of the arteries (atherosclerosis), diabetes, and cancer.   GUIDELINES FOR INCREASING FIBER IN THE DIET  Start adding fiber to the diet slowly. A gradual increase of about 5 more grams (2 slices of whole-wheat bread, 2 servings of most fruits or vegetables, or 1 bowl of high-fiber cereal) per day is best. Too rapid an increase in fiber may result in constipation, flatulence, and bloating.   Drink enough water and fluids to keep your urine clear or pale yellow. Water, juice, or caffeine-free drinks are recommended. Not drinking enough fluid may cause constipation.   Eat a variety of high-fiber foods rather than one type of fiber.   Try to increase your intake of fiber through using high-fiber foods rather than fiber pills or supplements that contain small amounts of fiber.   The goal is to change the types of food eaten. Do not supplement your present diet with high-fiber foods, but replace foods in your present diet.   INCLUDE A VARIETY OF FIBER SOURCES  Replace refined and processed grains with whole grains, canned fruits with fresh fruits, and incorporate other fiber sources. White rice, white breads, and most bakery goods contain little or no fiber.   Brown  whole-grain rice, buckwheat oats, and many fruits and vegetables are all good sources of fiber. These include: broccoli, Brussels sprouts, cabbage, cauliflower, beets, sweet potatoes, white potatoes (skin on), carrots, tomatoes, eggplant, squash, berries, fresh fruits, and dried fruits.   Cereals appear to be the richest source of fiber. Cereal fiber is found in whole grains and bran. Bran is the fiber-rich outer coat of cereal grain, which is largely removed in refining. In whole-grain cereals, the bran remains. In breakfast cereals, the largest amount of fiber is found in those with "bran" in their names. The fiber content is sometimes indicated on the label.   You may need to include additional fruits and vegetables each day.   In baking, for 1 cup white flour, you may use the following substitutions:   1 cup whole-wheat flour minus 2 tablespoons.   1/2 cup white flour plus 1/2 cup whole-wheat flour.    Polyps, Colon  A polyp is extra tissue that grows inside your body. Colon polyps grow in the large intestine. The large intestine, also called the colon, is part of your digestive system. It is a long, hollow  tube at the end of your digestive tract where your body makes and stores stool. Most polyps are not dangerous. They are benign. This means they are not cancerous. But over time, some types of polyps can turn into cancer. Polyps that are smaller than a pea are usually not harmful. But larger polyps could someday become or may already be cancerous. To be safe, doctors remove all polyps and test them.    PREVENTION There is not one sure way to prevent polyps. You might be able to lower your risk of getting them if you:  Eat more fruits and vegetables and less fatty food.   Do not smoke.   Avoid alcohol.   Exercise every day.   Lose weight if you are overweight.   Eating more calcium and folate can also lower your risk of getting polyps. Some foods that are rich in calcium are milk,  cheese, and broccoli. Some foods that are rich in folate are chickpeas, kidney beans, and spinach.   Diverticulosis Diverticulosis is a common condition that develops when small pouches (diverticula) form in the wall of the colon. The risk of diverticulosis increases with age. It happens more often in people who eat a low-fiber diet. Most individuals with diverticulosis have no symptoms. Those individuals with symptoms usually experience belly (abdominal) pain, constipation, or loose stools (diarrhea).  HOME CARE INSTRUCTIONS  Increase the amount of fiber in your diet as directed by your caregiver or dietician. This may reduce symptoms of diverticulosis.   Drink at least 6 to 8 glasses of water each day to prevent constipation.   Try not to strain when you have a bowel movement.   Avoiding nuts and seeds to prevent complications is NOT NECESSARY.    SEEK IMMEDIATE MEDICAL CARE IF:  You develop increasing pain or severe bloating.   You have an oral temperature above 101F.   You develop vomiting or bowel movements that are bloody or black.

## 2018-08-27 NOTE — Telephone Encounter (Signed)
PATIENT SCHEDULED AND ON RECALL  °

## 2018-08-28 ENCOUNTER — Encounter (HOSPITAL_COMMUNITY): Payer: Self-pay | Admitting: Gastroenterology

## 2018-09-09 ENCOUNTER — Other Ambulatory Visit: Payer: Self-pay | Admitting: Cardiovascular Disease
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
## 2018-10-01 ENCOUNTER — Other Ambulatory Visit (HOSPITAL_COMMUNITY)
Admission: RE | Admit: 2018-10-01 | Discharge: 2018-10-01 | Disposition: A | Discharge status: Home / Self Care | Payer: Medicare Other | Source: Ambulatory Visit | Attending: Cardiovascular Disease | Admitting: Cardiovascular Disease

## 2018-10-01 ENCOUNTER — Encounter: Payer: Self-pay | Admitting: Cardiovascular Disease

## 2018-10-01 ENCOUNTER — Ambulatory Visit (INDEPENDENT_AMBULATORY_CARE_PROVIDER_SITE_OTHER): Payer: Medicare Other | Admitting: Cardiovascular Disease

## 2018-10-01 VITALS — BP 122/70 | HR 60 | Ht 69.0 in | Wt 176.0 lb

## 2018-10-01 DIAGNOSIS — R079 Chest pain, unspecified: Secondary | ICD-10-CM | POA: Diagnosis not present

## 2018-10-01 DIAGNOSIS — I2510 Atherosclerotic heart disease of native coronary artery without angina pectoris: Secondary | ICD-10-CM | POA: Insufficient documentation

## 2018-10-01 DIAGNOSIS — R0600 Dyspnea, unspecified: Secondary | ICD-10-CM | POA: Insufficient documentation

## 2018-10-01 LAB — BASIC METABOLIC PANEL
Anion gap: 6 (ref 5–15)
BUN: 20 mg/dL (ref 8–23)
CO2: 27 mmol/L (ref 22–32)
Calcium: 9.3 mg/dL (ref 8.9–10.3)
Chloride: 105 mmol/L (ref 98–111)
Creatinine, Ser: 1.29 mg/dL — ABNORMAL HIGH (ref 0.61–1.24)
GFR calc Af Amer: >60 mL/min (ref >60)
GFR calc non Af Amer: 57 mL/min — ABNORMAL LOW (ref >60)
Glucose, Bld: 112 mg/dL — ABNORMAL HIGH (ref 70–99)
POTASSIUM: 4.6 mmol/L (ref 3.5–5.1)
SODIUM: 138 mmol/L (ref 135–145)

## 2018-10-01 LAB — PSA: PROSTATIC SPECIFIC ANTIGEN: 3.02 ng/mL (ref 0.00–4.00)

## 2018-10-01 NOTE — Patient Instructions (Addendum)
Medication Instructions:  Your physician recommends that you continue on your current medications as directed. Please refer to the Current Medication list given to you today.   [place]  BMET PSA Testing/Procedures: Your physician has requested that you have en exercise stress myoview. For further information please visit [email or web address]. Please follow instruction sheet, as given.  Your physician has requested that you have an echocardiogram. Echocardiography is a painless test that uses sound waves to create images of your heart. It provides your doctor with information about the size and shape of your heart and how well your heart's chambers and valves are working. This procedure takes approximately one hour. There are no restrictions for this procedure.      Follow-Up: Your physician wants you to follow-up in: 1 YEAR.  You will receive a reminder letter in the mail two months in advance. If you don't receive a letter, please call our office to schedule the follow-up appointment.   Any Other Special Instructions Will Be Listed Below (If Applicable).     If you need a refill on your cardiac medications before your next appointment, please call your pharmacy.

## 2018-10-11 ENCOUNTER — Ambulatory Visit (HOSPITAL_COMMUNITY): Payer: Medicare Other

## 2018-10-11 ENCOUNTER — Encounter (HOSPITAL_COMMUNITY): Payer: Medicare Other

## 2018-10-11 ENCOUNTER — Other Ambulatory Visit (HOSPITAL_COMMUNITY): Payer: Medicare Other

## 2018-11-04 ENCOUNTER — Ambulatory Visit (HOSPITAL_COMMUNITY)
Admission: RE | Admit: 2018-11-04 | Discharge: 2018-11-04 | Disposition: A | Discharge status: Home / Self Care | Payer: Medicare Other | Source: Ambulatory Visit | Attending: Cardiovascular Disease | Admitting: Cardiovascular Disease

## 2018-11-04 ENCOUNTER — Encounter (HOSPITAL_COMMUNITY)
Admission: RE | Admit: 2018-11-04 | Discharge: 2018-11-04 | Disposition: A | Discharge status: Home / Self Care | Payer: Medicare Other | Source: Ambulatory Visit | Attending: Cardiovascular Disease | Admitting: Cardiovascular Disease

## 2018-11-04 ENCOUNTER — Encounter (HOSPITAL_COMMUNITY): Payer: Medicare Other

## 2018-11-04 ENCOUNTER — Encounter (HOSPITAL_COMMUNITY): Payer: Self-pay

## 2018-11-04 DIAGNOSIS — R079 Chest pain, unspecified: Secondary | ICD-10-CM | POA: Diagnosis not present

## 2018-11-04 DIAGNOSIS — R0600 Dyspnea, unspecified: Secondary | ICD-10-CM | POA: Diagnosis not present

## 2018-11-04 DIAGNOSIS — I2510 Atherosclerotic heart disease of native coronary artery without angina pectoris: Secondary | ICD-10-CM | POA: Insufficient documentation

## 2018-11-04 LAB — NM MYOCAR MULTI W/SPECT W/WALL MOTION / EF
CSEPED: 8 min
Estimated workload: 10.4 METS
Exercise duration (sec): 51 s
LHR: 0.36
LVDIAVOL: 79 mL (ref 62–150)
LVSYSVOL: 34 mL
MPHR: 154 {beats}/min
NUC STRESS TID: 1.07
Peak HR: 137 {beats}/min
Percent HR: 88 %
RPE: 13
Rest HR: 50 {beats}/min
SDS: 3
SRS: 1
SSS: 4

## 2018-11-04 MED ORDER — TECHNETIUM TC 99M TETROFOSMIN IV KIT
10.0000 | PACK | Freq: Once | INTRAVENOUS | Status: AC | PRN
  Administered 2018-11-04: 10 via INTRAVENOUS

## 2018-11-04 MED ORDER — TECHNETIUM TC 99M TETROFOSMIN IV KIT
30.0000 | PACK | Freq: Once | INTRAVENOUS | Status: AC | PRN
  Administered 2018-11-04: 29.8 via INTRAVENOUS

## 2018-11-04 MED ORDER — SODIUM CHLORIDE 0.9% FLUSH
INTRAVENOUS | Status: AC
  Administered 2018-11-04: 10 mL via INTRAVENOUS
  Filled 2018-11-04: qty 10

## 2018-11-04 MED ORDER — REGADENOSON 0.4 MG/5ML IV SOLN
INTRAVENOUS | Status: AC
  Filled 2018-11-04: qty 5

## 2018-11-04 NOTE — Progress Notes (Signed)
*  PRELIMINARY RESULTS* Echocardiogram 2D Echocardiogram has been performed.  Terry Powell 11/04/2018, 9:36 AM

## 2018-11-26 ENCOUNTER — Encounter: Payer: Self-pay | Admitting: Gastroenterology

## 2018-12-08 ENCOUNTER — Other Ambulatory Visit: Payer: Self-pay | Admitting: Cardiovascular Disease

## 2018-12-24 ENCOUNTER — Ambulatory Visit: Payer: Medicare Other | Admitting: Gastroenterology

## 2018-12-24 DIAGNOSIS — I10 Essential (primary) hypertension: Secondary | ICD-10-CM | POA: Diagnosis not present

## 2018-12-24 DIAGNOSIS — Z91041 Radiographic dye allergy status: Secondary | ICD-10-CM | POA: Diagnosis not present

## 2018-12-24 DIAGNOSIS — E7800 Pure hypercholesterolemia, unspecified: Secondary | ICD-10-CM | POA: Diagnosis not present

## 2018-12-24 DIAGNOSIS — S0502XA Injury of conjunctiva and corneal abrasion without foreign body, left eye, initial encounter: Secondary | ICD-10-CM | POA: Diagnosis not present

## 2018-12-24 DIAGNOSIS — T1590XA Foreign body on external eye, part unspecified, unspecified eye, initial encounter: Secondary | ICD-10-CM | POA: Diagnosis not present

## 2018-12-25 DIAGNOSIS — T1502XA Foreign body in cornea, left eye, initial encounter: Secondary | ICD-10-CM | POA: Diagnosis not present

## 2018-12-30 DIAGNOSIS — T1502XD Foreign body in cornea, left eye, subsequent encounter: Secondary | ICD-10-CM | POA: Diagnosis not present

## 2019-02-26 ENCOUNTER — Ambulatory Visit: Payer: Medicare Other | Admitting: Gastroenterology

## 2019-03-03 DIAGNOSIS — Z713 Dietary counseling and surveillance: Secondary | ICD-10-CM | POA: Diagnosis not present

## 2019-03-03 DIAGNOSIS — Z8639 Personal history of other endocrine, nutritional and metabolic disease: Secondary | ICD-10-CM | POA: Diagnosis not present

## 2019-03-03 DIAGNOSIS — Z299 Encounter for prophylactic measures, unspecified: Secondary | ICD-10-CM | POA: Diagnosis not present

## 2019-03-03 DIAGNOSIS — Z6825 Body mass index (BMI) 25.0-25.9, adult: Secondary | ICD-10-CM | POA: Diagnosis not present

## 2019-03-03 DIAGNOSIS — Z6839 Body mass index (BMI) 39.0-39.9, adult: Secondary | ICD-10-CM | POA: Diagnosis not present

## 2019-03-03 DIAGNOSIS — M109 Gout, unspecified: Secondary | ICD-10-CM | POA: Diagnosis not present

## 2019-04-14 DIAGNOSIS — H0102B Squamous blepharitis left eye, upper and lower eyelids: Secondary | ICD-10-CM | POA: Diagnosis not present

## 2019-04-14 DIAGNOSIS — H0102A Squamous blepharitis right eye, upper and lower eyelids: Secondary | ICD-10-CM | POA: Diagnosis not present

## 2019-04-14 DIAGNOSIS — H2513 Age-related nuclear cataract, bilateral: Secondary | ICD-10-CM | POA: Diagnosis not present

## 2019-05-23 DIAGNOSIS — R42 Dizziness and giddiness: Secondary | ICD-10-CM | POA: Diagnosis not present

## 2019-05-23 DIAGNOSIS — R55 Syncope and collapse: Secondary | ICD-10-CM | POA: Diagnosis not present

## 2019-05-23 DIAGNOSIS — I10 Essential (primary) hypertension: Secondary | ICD-10-CM | POA: Diagnosis not present

## 2019-05-23 DIAGNOSIS — R5383 Other fatigue: Secondary | ICD-10-CM | POA: Diagnosis not present

## 2019-05-23 DIAGNOSIS — Z299 Encounter for prophylactic measures, unspecified: Secondary | ICD-10-CM | POA: Diagnosis not present

## 2019-05-23 DIAGNOSIS — Z6826 Body mass index (BMI) 26.0-26.9, adult: Secondary | ICD-10-CM | POA: Diagnosis not present

## 2019-05-27 DIAGNOSIS — H6123 Impacted cerumen, bilateral: Secondary | ICD-10-CM | POA: Diagnosis not present

## 2019-05-27 DIAGNOSIS — H9192 Unspecified hearing loss, left ear: Secondary | ICD-10-CM | POA: Diagnosis not present

## 2019-05-27 DIAGNOSIS — H66002 Acute suppurative otitis media without spontaneous rupture of ear drum, left ear: Secondary | ICD-10-CM | POA: Diagnosis not present

## 2019-06-10 DIAGNOSIS — R42 Dizziness and giddiness: Secondary | ICD-10-CM | POA: Diagnosis not present

## 2019-06-10 DIAGNOSIS — H9192 Unspecified hearing loss, left ear: Secondary | ICD-10-CM | POA: Diagnosis not present

## 2019-06-10 DIAGNOSIS — H66002 Acute suppurative otitis media without spontaneous rupture of ear drum, left ear: Secondary | ICD-10-CM | POA: Diagnosis not present

## 2019-06-25 DIAGNOSIS — H903 Sensorineural hearing loss, bilateral: Secondary | ICD-10-CM | POA: Diagnosis not present

## 2019-06-26 DIAGNOSIS — I10 Essential (primary) hypertension: Secondary | ICD-10-CM | POA: Diagnosis not present

## 2019-06-26 DIAGNOSIS — Z1339 Encounter for screening examination for other mental health and behavioral disorders: Secondary | ICD-10-CM | POA: Diagnosis not present

## 2019-06-26 DIAGNOSIS — N400 Enlarged prostate without lower urinary tract symptoms: Secondary | ICD-10-CM | POA: Diagnosis not present

## 2019-06-26 DIAGNOSIS — Z7189 Other specified counseling: Secondary | ICD-10-CM | POA: Diagnosis not present

## 2019-06-26 DIAGNOSIS — Z0000 Encounter for general adult medical examination without abnormal findings: Secondary | ICD-10-CM | POA: Diagnosis not present

## 2019-06-26 DIAGNOSIS — Z1331 Encounter for screening for depression: Secondary | ICD-10-CM | POA: Diagnosis not present

## 2019-06-26 DIAGNOSIS — Z6826 Body mass index (BMI) 26.0-26.9, adult: Secondary | ICD-10-CM | POA: Diagnosis not present

## 2019-06-26 DIAGNOSIS — R5383 Other fatigue: Secondary | ICD-10-CM | POA: Diagnosis not present

## 2019-06-26 DIAGNOSIS — Z299 Encounter for prophylactic measures, unspecified: Secondary | ICD-10-CM | POA: Diagnosis not present

## 2019-06-26 DIAGNOSIS — Z79899 Other long term (current) drug therapy: Secondary | ICD-10-CM | POA: Diagnosis not present

## 2019-08-13 DIAGNOSIS — M542 Cervicalgia: Secondary | ICD-10-CM | POA: Diagnosis not present

## 2019-08-13 DIAGNOSIS — M5412 Radiculopathy, cervical region: Secondary | ICD-10-CM | POA: Diagnosis not present

## 2019-08-13 DIAGNOSIS — M47812 Spondylosis without myelopathy or radiculopathy, cervical region: Secondary | ICD-10-CM | POA: Diagnosis not present

## 2019-08-20 ENCOUNTER — Other Ambulatory Visit: Payer: Self-pay | Admitting: Neurosurgery

## 2019-08-20 DIAGNOSIS — M5412 Radiculopathy, cervical region: Secondary | ICD-10-CM

## 2019-08-21 ENCOUNTER — Other Ambulatory Visit: Payer: Self-pay | Admitting: Neurosurgery

## 2019-08-21 DIAGNOSIS — Z77018 Contact with and (suspected) exposure to other hazardous metals: Secondary | ICD-10-CM

## 2019-09-07 ENCOUNTER — Other Ambulatory Visit: Payer: Self-pay | Admitting: Gastroenterology

## 2019-09-08 ENCOUNTER — Ambulatory Visit: Payer: Medicare Other | Admitting: Cardiovascular Disease

## 2019-09-10 ENCOUNTER — Other Ambulatory Visit: Payer: Medicare Other

## 2019-09-17 DIAGNOSIS — Z789 Other specified health status: Secondary | ICD-10-CM | POA: Diagnosis not present

## 2019-09-17 DIAGNOSIS — Z6826 Body mass index (BMI) 26.0-26.9, adult: Secondary | ICD-10-CM | POA: Diagnosis not present

## 2019-09-17 DIAGNOSIS — L0211 Cutaneous abscess of neck: Secondary | ICD-10-CM | POA: Diagnosis not present

## 2019-09-17 DIAGNOSIS — Z299 Encounter for prophylactic measures, unspecified: Secondary | ICD-10-CM | POA: Diagnosis not present

## 2019-09-17 DIAGNOSIS — I10 Essential (primary) hypertension: Secondary | ICD-10-CM | POA: Diagnosis not present

## 2019-09-29 DIAGNOSIS — L738 Other specified follicular disorders: Secondary | ICD-10-CM | POA: Diagnosis not present

## 2019-09-29 DIAGNOSIS — L570 Actinic keratosis: Secondary | ICD-10-CM | POA: Diagnosis not present

## 2019-09-29 DIAGNOSIS — L814 Other melanin hyperpigmentation: Secondary | ICD-10-CM | POA: Diagnosis not present

## 2019-09-29 DIAGNOSIS — D225 Melanocytic nevi of trunk: Secondary | ICD-10-CM | POA: Diagnosis not present

## 2019-09-29 DIAGNOSIS — L821 Other seborrheic keratosis: Secondary | ICD-10-CM | POA: Diagnosis not present

## 2019-10-01 ENCOUNTER — Ambulatory Visit
Admission: RE | Admit: 2019-10-01 | Discharge: 2019-10-01 | Disposition: A | Discharge status: Home / Self Care | Payer: Medicare Other | Source: Ambulatory Visit | Attending: Neurosurgery | Admitting: Neurosurgery

## 2019-10-01 ENCOUNTER — Other Ambulatory Visit: Payer: Self-pay

## 2019-10-01 DIAGNOSIS — Z77018 Contact with and (suspected) exposure to other hazardous metals: Secondary | ICD-10-CM

## 2019-10-01 DIAGNOSIS — M5412 Radiculopathy, cervical region: Secondary | ICD-10-CM

## 2019-10-01 DIAGNOSIS — Z135 Encounter for screening for eye and ear disorders: Secondary | ICD-10-CM | POA: Diagnosis not present

## 2019-10-01 DIAGNOSIS — M4802 Spinal stenosis, cervical region: Secondary | ICD-10-CM | POA: Diagnosis not present

## 2019-10-06 DIAGNOSIS — M542 Cervicalgia: Secondary | ICD-10-CM | POA: Diagnosis not present

## 2019-10-06 DIAGNOSIS — Z6825 Body mass index (BMI) 25.0-25.9, adult: Secondary | ICD-10-CM | POA: Diagnosis not present

## 2019-10-06 DIAGNOSIS — M47812 Spondylosis without myelopathy or radiculopathy, cervical region: Secondary | ICD-10-CM | POA: Diagnosis not present

## 2019-10-06 DIAGNOSIS — R030 Elevated blood-pressure reading, without diagnosis of hypertension: Secondary | ICD-10-CM | POA: Diagnosis not present

## 2019-11-03 NOTE — Progress Notes (Signed)
Cardiology Office Note   Date:  11/10/2019   ID:  [name], DOB 1950/11/14, MRN [id number]  PCP:  Arsenio Katz, NP  Cardiologist:  Dr. Aundra Dubin    No chief complaint on file.     History of Present Illness: Terry Powell is a 68 y.o. male who presents for CAD and hyperlipidemia, HTN follow up.  First seen by me August 2018  He has a history of HTN and hyperlipidemia.  Seen by Dr. Aundra Dubin 01/2015.  In 8/12, patient developed severe substernal chest pain.  He went to the ER at [facility].  Cardiac enzymes were positive, so he was sent to [facility].  Left heart cath showed EF 55% and plaque + significant mobile thrombus in the proximal LAD creating about 80% stenosis.  He was treated with ticagrelor and heparin gtt.  Re-look cath several days later showed only  20-25% proximal LAD stenosis and resolution of the thrombus.  Ticagrelor was continued for a year then stopped.  He had a non ischemic myovue 02/2015   Lives alone Mother next to him is 32.  F/U myovue 11/04/18 no ischemia EF 57%  Echo 11/04/18 EF 60-65% no significant valve disease   Tends to run low HR mostly in mid 50's not on beta blocker ECG from primary reviewed asymptomatic but SB rate 46 with no AV block and otherwise normal 06/02/19    Past Medical History:  Diagnosis Date  . Allergy    IV dye, seasonal  . Anxiety   . Arthritis   . Blood transfusion without reported diagnosis   . Cataract   . Clotting disorder ([place])   . Depression    since back surgery  . GERD (gastroesophageal reflux disease) 04/30/2018  . Gout   . Hypercholesteremia   . Hypertension   . Myocardial infarction ([place])   . Solitary kidney, congenital     Past Surgical History:  Procedure Laterality Date  . ANKLE FRACTURE SURGERY    . BACK SURGERY    . BIOPSY  08/23/2018   Procedure: BIOPSY;  Surgeon: Danie Binder, MD;  [facility];  Service: Endoscopy;;  biopsy of cardia  . COLONOSCOPY  06/08/2011   Dr. Oneida Alar: 2 sessile  polyps removed from the descending colon.  One was lower adenoma.  Diverticular also seen.  Hemorrhoids.TCS recommended in 2017.   Marland Kitchen COLONOSCOPY N/A 08/23/2018   Procedure: COLONOSCOPY;  Surgeon: Danie Binder, MD;  [facility];  Service: Endoscopy;  Laterality: N/A;  12:00PM  . ESOPHAGEAL DILATION  06/08/2011   Procedure: ESOPHAGEAL DILATION;  Surgeon: Dorothyann Peng, MD;  [facility];  Service: Endoscopy;;  . ESOPHAGOGASTRODUODENOSCOPY  06/08/2011   Dr. Oneida Alar: Leftward duodenum, esophageal stricture status post dilation, hiatal hernia, gastritis/duodenitis..  No H. pylori on path.  . ESOPHAGOGASTRODUODENOSCOPY N/A 08/23/2018   Procedure: ESOPHAGOGASTRODUODENOSCOPY (EGD);  Surgeon: Danie Binder, MD;  [facility];  Service: Endoscopy;  Laterality: N/A;  . POLYPECTOMY  08/23/2018   Procedure: POLYPECTOMY;  Surgeon: Danie Binder, MD;  [facility];  Service: Endoscopy;;  polyp at ascending colon, descending colon polyps x2, rectal polyp,  . SAVORY DILATION N/A 08/23/2018   Procedure: SAVORY DILATION;  Surgeon: Danie Binder, MD;  [facility];  Service: Endoscopy;  Laterality: N/A;  . [place]   2-3 discs injured , fusion  . TONSILLECTOMY    . VASECTOMY       Current Outpatient  Medications  Medication Sig Dispense Refill  . aspirin EC 81 MG tablet Take 1 tablet (81 mg total) by mouth daily.    Marland Kitchen atorvastatin (LIPITOR) 80 MG tablet TAKE 1 TABLET BY MOUTH ONCE DAILY 90 tablet 3  . calcium carbonate (TUMS - DOSED IN MG ELEMENTAL CALCIUM) 500 MG chewable tablet Chew 1 tablet by mouth as needed for indigestion or heartburn.    . cholecalciferol (VITAMIN D) 1000 units tablet Take 1,000 Units by mouth daily.    . Colchicine (MITIGARE) 0.6 MG CAPS Take 2 pills at the first sign of gout.  Repeat dose at one hour Then one a day until gone (Patient taking differently: Take 2 tablets by mouth See admin instructions. Take 2 pills  at the first sign of gout.  Repeat dose at one hour Then one a day until gone) 30 capsule 11  . docusate sodium (COLACE) 100 MG capsule Take 300 mg by mouth daily as needed for moderate constipation.     Marland Kitchen FLUoxetine (PROZAC) 20 MG capsule Take 20 mg by mouth daily.     Marland Kitchen losartan (COZAAR) 50 MG tablet TAKE 1 TABLET BY MOUTH ONCE DAILY 90 tablet 3  . nitroGLYCERIN (NITROSTAT) 0.4 MG SL tablet Place 1 tablet (0.4 mg total) under the tongue every 5 (five) minutes as needed. 20 tablet 2  . pantoprazole (PROTONIX) 40 MG tablet TAKE 1 TABLET BY MOUTH ONCE DAILY 30  MINUTES  PRIOR  TO  THE  FIRST  MEAL  OF  THE  DAY 90 tablet 1  . polyethylene glycol (MIRALAX / GLYCOLAX) packet Take 17 g by mouth daily.     No current facility-administered medications for this visit.    Allergies:   Contrast media [iodinated diagnostic agents]    Social History:  The patient  reports that he has never smoked. He has never used smokeless tobacco. He reports that he does not drink alcohol or use drugs.   Family History:  The patient's family history includes Arthritis in his father and mother; Asthma in his mother; COPD in his mother; Cancer (age of onset: 73) in his maternal grandfather; Colon cancer (age of onset: 22) in his maternal grandfather; Depression in his mother; Diabetes in his mother; Heart disease (age of onset: 64) in his father; Hyperlipidemia in his father and mother; Hypertension in his mother; Kidney disease in his mother; Stroke in his maternal grandfather and paternal grandfather; Ulcers in his mother; Ulcers (age of onset: 57) in his father.    ROS:  General:no colds or fevers, + weight increase from last year Skin:no rashes or ulcers HEENT:no blurred vision, no congestion CV:see HPI PUL:see HPI GI:no diarrhea constipation or melena, no indigestion GU:no hematuria, no dysuria MS:no joint pain, no claudication Neuro:no syncope, no lightheadedness Endo:no diabetes, no thyroid disease  Wt  Readings from Last 3 Encounters:  11/10/19 170 lb (77.1 kg)  10/01/18 176 lb (79.8 kg)  08/23/18 165 lb (74.8 kg)     PHYSICAL EXAM: VS:  BP (!) 126/92   Pulse (!) 56   Temp 97.7 F (36.5 C) (Temporal)   Ht 5\' 9"  (1.753 m)   Wt 170 lb (77.1 kg)   SpO2 98%   BMI 25.10 kg/m  , BMI Body mass index is 25.1 kg/m. Affect appropriate Healthy:  appears stated age 54: normal Neck supple with no adenopathy JVP normal no bruits no thyromegaly Lungs clear with no wheezing and good diaphragmatic motion Heart:  S1/S2 no murmur, no rub,  gallop or click PMI normal Abdomen: benighn, BS positve, no tenderness, no AAA no bruit.  No HSM or HJR Distal pulses intact with no bruits No edema Neuro non-focal Skin warm and dry No muscular weakness     EKG:  06/15/16  SR LVH, non specific ST abnormality.   06/08/17  SR rate 51 LVH  10/01/18 SR Rate 60 nonspecific ST changes    Recent Labs: No results found for requested labs within last 8760 hours.    Lipid Panel    Component Value Date/Time   CHOL 149 06/08/2017 1457   TRIG 177 (H) 06/08/2017 1457   HDL 39 (L) 06/08/2017 1457   CHOLHDL 3.8 06/08/2017 1457   CHOLHDL 3.8 06/29/2016 0944   VLDL 31 (H) 06/29/2016 0944   LDLCALC 75 06/08/2017 1457       Other studies Reviewed: Additional studies/ records that were reviewed today include: Notes Dr Aundra Dubin and PA Cecilie Kicks Myovue 2016 And cath films from 2016  Nuc study 02/2015 .  Overall Impression:  Low risk stress nuclear study with a small, mild, partially reversible inferior defect consistent with inferior thinning and possible minimal inferior ischemia.  LV Ejection Fraction: 54%.  LV Wall Motion:  NL LV Function; NL Wall Motion  Nuc study 11/04/18    Probable normal perfusion and mild soft tissue attenuation (diaphragm) No significant ischemia or scar  There was no ST segment deviation noted during stress.  Nuclear stress EF: 57%.  This is a low risk study.   Echo: 11/04/18  Study Conclusions  - Left ventricle: The cavity size was normal. Wall thickness was   increased in a pattern of mild LVH. Systolic function was normal.   The estimated ejection fraction was in the range of 60% to 65%.   The study is not technically sufficient to allow evaluation of LV   diastolic function.  ASSESSMENT AND PLAN:   1. CAD:  Thrombotic lesion LAD 2012 resolved with anticoagulation. Normal myovue 2016 and again 11/04/18 with no ischemia and EF 57%   stable no beta blocker due to bradycardia continue ASA and statin     2. Bradycardia: Asymptomatic, good chronotropic response to exercise Avoid beta blockers   3. Hyperlipidemia: at goal on statin labs with primary    4. Renal:  History of solitary kidney and enlarged prostate Baseline Cr around 1.3 PSA 3 10/01/18   Jenkins Rouge, MD

## 2019-11-10 ENCOUNTER — Other Ambulatory Visit: Payer: Self-pay

## 2019-11-10 ENCOUNTER — Encounter: Payer: Self-pay | Admitting: Cardiovascular Disease

## 2019-11-10 ENCOUNTER — Ambulatory Visit (INDEPENDENT_AMBULATORY_CARE_PROVIDER_SITE_OTHER): Payer: Medicare Other | Admitting: Cardiovascular Disease

## 2019-11-10 VITALS — BP 126/92 | HR 56 | Temp 97.7°F | Ht 69.0 in | Wt 170.0 lb

## 2019-11-10 DIAGNOSIS — R001 Bradycardia, unspecified: Secondary | ICD-10-CM | POA: Diagnosis not present

## 2019-11-10 DIAGNOSIS — I2510 Atherosclerotic heart disease of native coronary artery without angina pectoris: Secondary | ICD-10-CM

## 2019-11-10 NOTE — Patient Instructions (Signed)

## 2019-11-27 DIAGNOSIS — N419 Inflammatory disease of prostate, unspecified: Secondary | ICD-10-CM | POA: Diagnosis not present

## 2019-11-27 DIAGNOSIS — R350 Frequency of micturition: Secondary | ICD-10-CM | POA: Diagnosis not present

## 2019-11-27 DIAGNOSIS — Z6826 Body mass index (BMI) 26.0-26.9, adult: Secondary | ICD-10-CM | POA: Diagnosis not present

## 2019-11-27 DIAGNOSIS — I2510 Atherosclerotic heart disease of native coronary artery without angina pectoris: Secondary | ICD-10-CM | POA: Diagnosis not present

## 2019-11-27 DIAGNOSIS — I10 Essential (primary) hypertension: Secondary | ICD-10-CM | POA: Diagnosis not present

## 2019-11-27 DIAGNOSIS — Z299 Encounter for prophylactic measures, unspecified: Secondary | ICD-10-CM | POA: Diagnosis not present

## 2019-11-27 DIAGNOSIS — Z789 Other specified health status: Secondary | ICD-10-CM | POA: Diagnosis not present

## 2019-12-05 ENCOUNTER — Other Ambulatory Visit: Payer: Self-pay | Admitting: Cardiovascular Disease

## 2020-01-15 DIAGNOSIS — M47812 Spondylosis without myelopathy or radiculopathy, cervical region: Secondary | ICD-10-CM | POA: Diagnosis not present

## 2020-01-20 DIAGNOSIS — Z23 Encounter for immunization: Secondary | ICD-10-CM | POA: Diagnosis not present

## 2020-02-02 DIAGNOSIS — M47812 Spondylosis without myelopathy or radiculopathy, cervical region: Secondary | ICD-10-CM | POA: Diagnosis not present

## 2020-02-09 DIAGNOSIS — F329 Major depressive disorder, single episode, unspecified: Secondary | ICD-10-CM | POA: Diagnosis not present

## 2020-02-09 DIAGNOSIS — I2510 Atherosclerotic heart disease of native coronary artery without angina pectoris: Secondary | ICD-10-CM | POA: Diagnosis not present

## 2020-02-09 DIAGNOSIS — M4802 Spinal stenosis, cervical region: Secondary | ICD-10-CM | POA: Diagnosis not present

## 2020-02-09 DIAGNOSIS — Z7689 Persons encountering health services in other specified circumstances: Secondary | ICD-10-CM | POA: Diagnosis not present

## 2020-02-09 DIAGNOSIS — E785 Hyperlipidemia, unspecified: Secondary | ICD-10-CM | POA: Diagnosis not present

## 2020-02-09 DIAGNOSIS — M720 Palmar fascial fibromatosis [Dupuytren]: Secondary | ICD-10-CM | POA: Diagnosis not present

## 2020-02-09 DIAGNOSIS — Z6825 Body mass index (BMI) 25.0-25.9, adult: Secondary | ICD-10-CM | POA: Diagnosis not present

## 2020-02-17 DIAGNOSIS — Z23 Encounter for immunization: Secondary | ICD-10-CM | POA: Diagnosis not present

## 2020-03-16 ENCOUNTER — Other Ambulatory Visit: Payer: Self-pay | Admitting: Nurse Practitioner

## 2020-03-17 ENCOUNTER — Telehealth: Payer: Self-pay | Admitting: Gastroenterology

## 2020-03-17 NOTE — Telephone Encounter (Signed)
Needs ov for further refills. Limited supply of pantoprazole provided. Last seen in 04/2018.

## 2020-03-18 ENCOUNTER — Encounter: Payer: Self-pay | Admitting: Gastroenterology

## 2020-03-18 NOTE — Telephone Encounter (Signed)
SENT PATIENT LETTER TO CALL AND MAKE APPOINTMENT  °

## 2020-04-26 DIAGNOSIS — L02512 Cutaneous abscess of left hand: Secondary | ICD-10-CM | POA: Diagnosis not present

## 2020-04-26 DIAGNOSIS — M7989 Other specified soft tissue disorders: Secondary | ICD-10-CM | POA: Diagnosis not present

## 2020-04-30 DIAGNOSIS — L02416 Cutaneous abscess of left lower limb: Secondary | ICD-10-CM | POA: Diagnosis not present

## 2020-04-30 DIAGNOSIS — M7989 Other specified soft tissue disorders: Secondary | ICD-10-CM | POA: Diagnosis not present

## 2020-05-17 NOTE — Progress Notes (Signed)
Cardiology Office Note   Date:  05/26/2020   ID:  Terry Powell, DOB September 13, 1951, MRN [id number]  PCP:  Terry Rio, MD  Cardiologist:  Terry Powell    No chief complaint on file.     History of Present Illness: Terry Powell is a 69 y.o. male who presents for CAD and hyperlipidemia, HTN follow up.  First seen by me August 2018  He has a history of HTN and hyperlipidemia.  Seen by Terry Powell 01/2015.  In 06/18/15, patient developed severe substernal chest pain.  He went to the ER at [facility].  Cardiac enzymes were positive, so he was sent to [facility].  Left heart cath showed EF 55% and plaque + significant mobile thrombus in the proximal LAD creating about 80% stenosis.  He was treated with ticagrelor and heparin gtt.  Re-look cath several days later showed only  20-25% proximal LAD stenosis and resolution of the thrombus.  Ticagrelor was continued for a year then stopped.  He had a non ischemic myovue 02/2015   Lives alone Mother next to him is 68.  F/U myovue 11/04/18 no ischemia EF 57%  Echo 11/04/18 EF 60-65% no significant valve disease   Tends to run low HR mostly in mid 50's not on beta blocker ECG from primary reviewed asymptomatic but SB rate 46 with no AV block and otherwise normal 06/02/19   Had an infection in his left index finger finishing up 2 weeks of cleocin improved  He has a new primary Dr Terry Powell in [place] who will check lab work in August  Has had vaccine for COVID    Past Medical History:  Diagnosis Date  . Allergy    IV dye, seasonal  . Anxiety   . Arthritis   . Blood transfusion without reported diagnosis   . Cataract   . Clotting disorder ([place])   . Depression    since back surgery  . GERD (gastroesophageal reflux disease) 04/30/2018  . Gout   . Hypercholesteremia   . Hypertension   . Myocardial infarction ([place])   . Solitary kidney, congenital     Past Surgical History:  Procedure Laterality Date  . ANKLE FRACTURE SURGERY    . BACK  SURGERY    . BIOPSY  08/23/2018   Procedure: BIOPSY;  Surgeon: Terry Binder, MD;  [facility];  Service: Endoscopy;;  biopsy of cardia  . COLONOSCOPY  06/08/2011   Dr. Oneida Powell: 2 sessile polyps removed from the descending colon.  One was lower adenoma.  Diverticular also seen.  Hemorrhoids.TCS recommended in 2017.   Marland Kitchen COLONOSCOPY N/A 08/23/2018   Procedure: COLONOSCOPY;  Surgeon: Terry Binder, MD;  [facility];  Service: Endoscopy;  Laterality: N/A;  12:00PM  . ESOPHAGEAL DILATION  06/08/2011   Procedure: ESOPHAGEAL DILATION;  Surgeon: Terry Peng, MD;  [facility];  Service: Endoscopy;;  . ESOPHAGOGASTRODUODENOSCOPY  06/08/2011   Dr. Oneida Powell: Leftward duodenum, esophageal stricture status post dilation, hiatal hernia, gastritis/duodenitis..  No H. pylori on path.  . ESOPHAGOGASTRODUODENOSCOPY N/A 08/23/2018   Procedure: ESOPHAGOGASTRODUODENOSCOPY (EGD);  Surgeon: Terry Binder, MD;  [facility];  Service: Endoscopy;  Laterality: N/A;  . POLYPECTOMY  08/23/2018   Procedure: POLYPECTOMY;  Surgeon: Terry Binder, MD;  [facility];  Service: Endoscopy;;  polyp at ascending colon, descending colon polyps x2, rectal polyp,  . SAVORY DILATION N/A 08/23/2018   Procedure: SAVORY DILATION;  Surgeon: Terry Binder,  MD;  [facility];  Service: Endoscopy;  Laterality: N/A;  . [place]   2-3 discs injured , fusion  . TONSILLECTOMY    . VASECTOMY       Current Outpatient Medications  Medication Sig Dispense Refill  . aspirin EC 81 MG tablet Take 1 tablet (81 mg total) by mouth daily.    Marland Kitchen atorvastatin (LIPITOR) 80 MG tablet Take 1 tablet by mouth once daily 90 tablet 3  . calcium carbonate (TUMS - DOSED IN MG ELEMENTAL CALCIUM) 500 MG chewable tablet Chew 1 tablet by mouth as needed for indigestion or heartburn.    . cholecalciferol (VITAMIN D) 1000 units tablet Take 1,000 Units by mouth daily.    . Colchicine  (MITIGARE) 0.6 MG CAPS Take 2 pills at the first sign of gout.  Repeat dose at one hour Then one a day until gone (Patient taking differently: Take 2 tablets by mouth See admin instructions. Take 2 pills at the first sign of gout.  Repeat dose at one hour Then one a day until gone) 30 capsule 11  . docusate sodium (COLACE) 100 MG capsule Take 300 mg by mouth daily as needed for moderate constipation.     Marland Kitchen FLUoxetine (PROZAC) 20 MG capsule Take 20 mg by mouth daily.     Marland Kitchen losartan (COZAAR) 50 MG tablet Take 1 tablet by mouth once daily 90 tablet 3  . nitroGLYCERIN (NITROSTAT) 0.4 MG SL tablet Place 1 tablet (0.4 mg total) under the tongue every 5 (five) minutes as needed. 20 tablet 2  . pantoprazole (PROTONIX) 40 MG tablet TAKE 1 TABLET BY MOUTH ONCE DAILY 30  MINUTES  PRIOR  TO  THE  FIRST  MAIN  MEAL  OF  THE  DAY 90 tablet 0  . polyethylene glycol (MIRALAX / GLYCOLAX) packet Take 17 g by mouth daily.     No current facility-administered medications for this visit.    Allergies:   Contrast media [iodinated diagnostic agents]    Social History:  The patient  reports that he has never smoked. He has never used smokeless tobacco. He reports that he does not drink alcohol and does not use drugs.   Family History:  The patient's family history includes Arthritis in his father and mother; Asthma in his mother; COPD in his mother; Cancer (age of onset: 2) in his maternal grandfather; Colon cancer (age of onset: 15) in his maternal grandfather; Depression in his mother; Diabetes in his mother; Heart disease (age of onset: 1) in his father; Hyperlipidemia in his father and mother; Hypertension in his mother; Kidney disease in his mother; Stroke in his maternal grandfather and paternal grandfather; Ulcers in his mother; Ulcers (age of onset: 49) in his father.    ROS:  General:no colds or fevers, + weight increase from last year Skin:no rashes or ulcers HEENT:no blurred vision, no congestion CV:see  HPI PUL:see HPI GI:no diarrhea constipation or melena, no indigestion GU:no hematuria, no dysuria MS:no joint pain, no claudication Neuro:no syncope, no lightheadedness Endo:no diabetes, no thyroid disease  Wt Readings from Last 3 Encounters:  05/26/20 173 lb (78.5 kg)  11/10/19 170 lb (77.1 kg)  10/01/18 176 lb (79.8 kg)     PHYSICAL EXAM: VS:  BP 128/78   Pulse 76   Ht 5\' 9"  (1.753 m)   Wt 173 lb (78.5 kg)   SpO2 99%   BMI 25.55 kg/m  , BMI Body mass index is 25.55 kg/m. Affect  appropriate Healthy:  appears stated age 44: normal Neck supple with no adenopathy JVP normal no bruits no thyromegaly Lungs clear with no wheezing and good diaphragmatic motion Heart:  S1/S2 no murmur, no rub, gallop or click PMI normal Abdomen: benighn, BS positve, no tenderness, no AAA no bruit.  No HSM or HJR Distal pulses intact with no bruits No edema Neuro non-focal Skin warm and dry No muscular weakness   EKG:  05/26/20 SB rate 58 otherwise normal    Recent Labs: No results found for requested labs within last 8760 hours.    Lipid Panel    Component Value Date/Time   CHOL 149 06/08/2017 1457   TRIG 177 (H) 06/08/2017 1457   HDL 39 (L) 06/08/2017 1457   CHOLHDL 3.8 06/08/2017 1457   CHOLHDL 3.8 06/29/2016 0944   VLDL 31 (H) 06/29/2016 0944   LDLCALC 75 06/08/2017 1457     Other studies Reviewed: Additional studies/ records that were reviewed today include: Notes Dr Terry Powell and PA Cecilie Kicks Myovue 2016 And cath films from 2016  Nuc study 02/2015 .  Overall Impression:  Low risk stress nuclear study with a small, mild, partially reversible inferior defect consistent with inferior thinning and possible minimal inferior ischemia.  LV Ejection Fraction: 54%.  LV Wall Motion:  NL LV Function; NL Wall Motion  Nuc study 11/04/18    Probable normal perfusion and mild soft tissue attenuation (diaphragm) No significant ischemia or scar  There was no ST segment  deviation noted during stress.  Nuclear stress EF: 57%.  This is a low risk study.  Echo: 11/04/18  Study Conclusions  - Left ventricle: The cavity size was normal. Wall thickness was   increased in a pattern of mild LVH. Systolic function was normal.   The estimated ejection fraction was in the range of 60% to 65%.   The study is not technically sufficient to allow evaluation of LV   diastolic function.  ASSESSMENT AND PLAN:   1. CAD:  Thrombotic lesion LAD 2012 resolved with anticoagulation. Normal myovue 2016 and again 11/04/18 with no ischemia and EF 57%   stable no beta blocker due to bradycardia continue ASA and statin     2. Bradycardia: Asymptomatic, good chronotropic response to exercise Avoid beta blockers   3. Hyperlipidemia: at goal on statin labs with primary    4. Renal:  History of solitary kidney and enlarged prostate Baseline Cr around 1.3 PSA 3 10/01/18   Jenkins Rouge, MD

## 2020-05-26 ENCOUNTER — Other Ambulatory Visit: Payer: Self-pay

## 2020-05-26 ENCOUNTER — Ambulatory Visit (INDEPENDENT_AMBULATORY_CARE_PROVIDER_SITE_OTHER): Payer: Medicare Other | Admitting: Cardiovascular Disease

## 2020-05-26 ENCOUNTER — Encounter: Payer: Self-pay | Admitting: Cardiovascular Disease

## 2020-05-26 VITALS — BP 128/78 | HR 76 | Ht 69.0 in | Wt 173.0 lb

## 2020-05-26 DIAGNOSIS — R001 Bradycardia, unspecified: Secondary | ICD-10-CM

## 2020-05-26 DIAGNOSIS — I2510 Atherosclerotic heart disease of native coronary artery without angina pectoris: Secondary | ICD-10-CM

## 2020-05-26 NOTE — Patient Instructions (Signed)
Medication Instructions:  Your physician recommends that you continue on your current medications as directed. Please refer to the Current Medication list given to you today.  *If you need a refill on your cardiac medications before your next appointment, please call your pharmacy*   Lab Work: None today If you have labs (blood work) drawn today and your tests are completely normal, you will receive your results only by: . MyChart Message (if you have MyChart) OR . A paper copy in the mail If you have any lab test that is abnormal or we need to change your treatment, we will call you to review the results.   Testing/Procedures: None today   Follow-Up: At [facility], you and your health needs are our priority.  As part of our continuing mission to provide you with exceptional heart care, we have created designated Provider Care Teams.  These Care Teams include your primary Cardiologist (physician) and Advanced Practice Providers (APPs -  Physician Assistants and Nurse Practitioners) who all work together to provide you with the care you need, when you need it.  We recommend signing up for the patient portal called "MyChart".  Sign up information is provided on this After Visit Summary.  MyChart is used to connect with patients for Virtual Visits (Telemedicine).  Patients are able to view lab/test results, encounter notes, upcoming appointments, etc.  Non-urgent messages can be sent to your provider as well.   To learn more about what you can do with MyChart, go to [email or web address].    Your next appointment:   12 month(s)  The format for your next appointment:   In Person  Provider:   Peter Nishan, MD   Other Instructions None      Thank you for choosing [facility] !         

## 2020-06-07 DIAGNOSIS — H2513 Age-related nuclear cataract, bilateral: Secondary | ICD-10-CM | POA: Diagnosis not present

## 2020-06-07 DIAGNOSIS — H0102A Squamous blepharitis right eye, upper and lower eyelids: Secondary | ICD-10-CM | POA: Diagnosis not present

## 2020-06-07 DIAGNOSIS — H52203 Unspecified astigmatism, bilateral: Secondary | ICD-10-CM | POA: Diagnosis not present

## 2020-06-07 DIAGNOSIS — H524 Presbyopia: Secondary | ICD-10-CM | POA: Diagnosis not present

## 2020-06-07 DIAGNOSIS — H0102B Squamous blepharitis left eye, upper and lower eyelids: Secondary | ICD-10-CM | POA: Diagnosis not present

## 2020-06-07 DIAGNOSIS — H5213 Myopia, bilateral: Secondary | ICD-10-CM | POA: Diagnosis not present

## 2020-06-17 ENCOUNTER — Other Ambulatory Visit: Payer: Self-pay | Admitting: Gastroenterology

## 2020-06-22 NOTE — Telephone Encounter (Signed)
Providing limited Rx. Patient is scheduled for OV in September with Dr. Abbey Chatters. He will need to keep this appt for additional refills.

## 2020-06-23 NOTE — Telephone Encounter (Signed)
Called pt and informed him that [facility] [place], [place] is providing limited Rx. Patient was reminded to keep scheduled OV in September with Dr. Abbey Chatters. He was informed that we then could provide additional refills. Pt voiced understanding.

## 2020-06-24 DIAGNOSIS — L03116 Cellulitis of left lower limb: Secondary | ICD-10-CM | POA: Diagnosis not present

## 2020-06-24 DIAGNOSIS — L02416 Cutaneous abscess of left lower limb: Secondary | ICD-10-CM | POA: Diagnosis not present

## 2020-06-24 DIAGNOSIS — Z6825 Body mass index (BMI) 25.0-25.9, adult: Secondary | ICD-10-CM | POA: Diagnosis not present

## 2020-06-30 ENCOUNTER — Ambulatory Visit: Payer: Medicare Other | Admitting: Internal Medicine

## 2020-06-30 DIAGNOSIS — R7302 Impaired glucose tolerance (oral): Secondary | ICD-10-CM | POA: Diagnosis not present

## 2020-06-30 DIAGNOSIS — Z13220 Encounter for screening for lipoid disorders: Secondary | ICD-10-CM | POA: Diagnosis not present

## 2020-06-30 DIAGNOSIS — Z23 Encounter for immunization: Secondary | ICD-10-CM | POA: Diagnosis not present

## 2020-06-30 DIAGNOSIS — Z7289 Other problems related to lifestyle: Secondary | ICD-10-CM | POA: Diagnosis not present

## 2020-06-30 DIAGNOSIS — Z125 Encounter for screening for malignant neoplasm of prostate: Secondary | ICD-10-CM | POA: Diagnosis not present

## 2020-06-30 DIAGNOSIS — Z136 Encounter for screening for cardiovascular disorders: Secondary | ICD-10-CM | POA: Diagnosis not present

## 2020-06-30 DIAGNOSIS — Z0000 Encounter for general adult medical examination without abnormal findings: Secondary | ICD-10-CM | POA: Diagnosis not present

## 2020-06-30 DIAGNOSIS — Z6825 Body mass index (BMI) 25.0-25.9, adult: Secondary | ICD-10-CM | POA: Diagnosis not present

## 2020-07-21 ENCOUNTER — Other Ambulatory Visit: Payer: Self-pay

## 2020-07-21 ENCOUNTER — Encounter: Payer: Self-pay | Admitting: Internal Medicine

## 2020-07-21 ENCOUNTER — Ambulatory Visit (INDEPENDENT_AMBULATORY_CARE_PROVIDER_SITE_OTHER): Payer: Medicare Other | Admitting: Internal Medicine

## 2020-07-21 VITALS — BP 141/95 | HR 66 | Temp 97.3°F | Ht 69.0 in | Wt 174.6 lb

## 2020-07-21 DIAGNOSIS — D124 Benign neoplasm of descending colon: Secondary | ICD-10-CM | POA: Diagnosis not present

## 2020-07-21 DIAGNOSIS — K219 Gastro-esophageal reflux disease without esophagitis: Secondary | ICD-10-CM | POA: Diagnosis not present

## 2020-07-21 MED ORDER — PANTOPRAZOLE SODIUM 40 MG PO TBEC: DELAYED_RELEASE_TABLET | ORAL | 3 refills | Status: DC

## 2020-07-21 NOTE — Progress Notes (Signed)
Referring Provider: Leeanne Rio, MD Primary Care Physician:  Leeanne Rio, MD Primary GI:  Dr. Abbey Chatters  No chief complaint on file.   HPI:   Terry Powell is a 69 y.o. male who presents to the clinic today for regular office follow-up.  Last seen in 2019.  Has chronic reflux which is well controlled on Protonix 40 mg daily.  Denies any dysphagia.  No epigastric pain.  No melena or hematochezia.  No change in bowel habits.  Last colonoscopy was in 2019 where he had 1 small tubular adenoma removed with a 5-year recall.  Overall, patient states he is doing well today.  Has no complaints for me.  Past Medical History:  Diagnosis Date  . Allergy    IV dye, seasonal  . Anxiety   . Arthritis   . Blood transfusion without reported diagnosis   . Cataract   . Clotting disorder ([place])   . Depression    since back surgery  . GERD (gastroesophageal reflux disease) 04/30/2018  . Gout   . Hypercholesteremia   . Hypertension   . Myocardial infarction ([place])   . Solitary kidney, congenital     Past Surgical History:  Procedure Laterality Date  . ANKLE FRACTURE SURGERY    . BACK SURGERY    . BIOPSY  08/23/2018   Procedure: BIOPSY;  Surgeon: Danie Binder, MD;  [facility];  Service: Endoscopy;;  biopsy of cardia  . COLONOSCOPY  06/08/2011   Dr. Oneida Alar: 2 sessile polyps removed from the descending colon.  One was lower adenoma.  Diverticular also seen.  Hemorrhoids.TCS recommended in 2017.   Marland Kitchen COLONOSCOPY N/A 08/23/2018   Procedure: COLONOSCOPY;  Surgeon: Danie Binder, MD;  [facility];  Service: Endoscopy;  Laterality: N/A;  12:00PM  . ESOPHAGEAL DILATION  06/08/2011   Procedure: ESOPHAGEAL DILATION;  Surgeon: Dorothyann Peng, MD;  [facility];  Service: Endoscopy;;  . ESOPHAGOGASTRODUODENOSCOPY  06/08/2011   Dr. Oneida Alar: Leftward duodenum, esophageal stricture status post dilation, hiatal hernia, gastritis/duodenitis..  No H. pylori on path.  .  ESOPHAGOGASTRODUODENOSCOPY N/A 08/23/2018   Procedure: ESOPHAGOGASTRODUODENOSCOPY (EGD);  Surgeon: Danie Binder, MD;  [facility];  Service: Endoscopy;  Laterality: N/A;  . POLYPECTOMY  08/23/2018   Procedure: POLYPECTOMY;  Surgeon: Danie Binder, MD;  [facility];  Service: Endoscopy;;  polyp at ascending colon, descending colon polyps x2, rectal polyp,  . SAVORY DILATION N/A 08/23/2018   Procedure: SAVORY DILATION;  Surgeon: Danie Binder, MD;  [facility];  Service: Endoscopy;  Laterality: N/A;  . [place]   2-3 discs injured , fusion  . TONSILLECTOMY    . VASECTOMY      Current Outpatient Medications  Medication Sig Dispense Refill  . aspirin EC 81 MG tablet Take 1 tablet (81 mg total) by mouth daily.    Marland Kitchen atorvastatin (LIPITOR) 80 MG tablet Take 1 tablet by mouth once daily 90 tablet 3  . calcium carbonate (TUMS - DOSED IN MG ELEMENTAL CALCIUM) 500 MG chewable tablet Chew 1 tablet by mouth as needed for indigestion or heartburn.    . cholecalciferol (VITAMIN D) 1000 units tablet Take 1,000 Units by mouth daily.    . Colchicine (MITIGARE) 0.6 MG CAPS Take 2 pills at the first sign of gout.  Repeat dose at one hour Then one a day until gone (Patient taking differently: Take 2 tablets by mouth  See admin instructions. Take 2 pills at the first sign of gout.  Repeat dose at one hour Then one a day until gone) 30 capsule 11  . docusate sodium (COLACE) 100 MG capsule Take 300 mg by mouth daily as needed for moderate constipation.     Marland Kitchen FLUoxetine (PROZAC) 20 MG capsule Take 20 mg by mouth daily.     Marland Kitchen losartan (COZAAR) 50 MG tablet Take 1 tablet by mouth once daily 90 tablet 3  . nitroGLYCERIN (NITROSTAT) 0.4 MG SL tablet Place 1 tablet (0.4 mg total) under the tongue every 5 (five) minutes as needed. 20 tablet 2  . pantoprazole (PROTONIX) 40 MG tablet TAKE 1 TABLET BY MOUTH ONCE DAILY 30  MINUTES  PRIOR  TO  THE  FIRST  MAIN  MEAL  OF  THE   DAY 30 tablet 0  . polyethylene glycol (MIRALAX / GLYCOLAX) packet Take 17 g by mouth daily.     No current facility-administered medications for this visit.    Allergies as of 07/21/2020 - Review Complete 05/26/2020  Allergen Reaction Noted  . Contrast media [iodinated diagnostic agents] Shortness Of Breath and Other (See Comments) 05/22/2011    Family History  Problem Relation Age of Onset  . Ulcers Father 49       partial gastrectomy  . Heart disease Father 82  . Arthritis Father   . Hyperlipidemia Father   . Ulcers Mother        stomach  . Arthritis Mother   . Asthma Mother   . COPD Mother   . Depression Mother   . Diabetes Mother   . Hyperlipidemia Mother   . Hypertension Mother   . Kidney disease Mother   . Colon cancer Maternal Grandfather 44  . Stroke Maternal Grandfather   . Cancer Maternal Grandfather 75       colon  . Stroke Paternal Grandfather   . Liver disease Neg Hx     Social History   Socioeconomic History  . Marital status: Single    Spouse name: Not on file  . Number of children: 2  . Years of education: 69  . Highest education level: Not on file  Occupational History  . Occupation: retired    Comment: state trooper  Tobacco Use  . Smoking status: Never Smoker  . Smokeless tobacco: Never Used  Vaping Use  . Vaping Use: Never used  Substance and Sexual Activity  . Alcohol use: No  . Drug use: No  . Sexual activity: Yes    Birth control/protection: Surgical  Other Topics Concern  . Not on file  Social History Narrative   Retired Emergency planning/management officer   Lives alone   2 daughters - 2 grandsons   [place]   Works on [place] Strain:   . Difficulty of Paying Living Expenses: Not on file  Food Insecurity:   . Worried About Charity fundraiser in the Last Year: Not on file  . Ran Out of Food in the Last Year: Not on file  Transportation Needs:   . Lack of Transportation  (Medical): Not on file  . Lack of Transportation (Non-Medical): Not on file  Physical Activity:   . Days of Exercise per Week: Not on file  . Minutes of Exercise per Session: Not on file  Stress:   . Feeling of Stress : Not on file  Social Connections:   . Frequency  of Communication with Friends and Family: Not on file  . Frequency of Social Gatherings with Friends and Family: Not on file  . Attends Religious Services: Not on file  . Active Member of Clubs or Organizations: Not on file  . Attends Archivist Meetings: Not on file  . Marital Status: Not on file    Subjective: Review of Systems  Constitutional: Negative for chills and fever.  HENT: Negative for congestion and hearing loss.   Eyes: Negative for blurred vision and double vision.  Respiratory: Negative for cough and shortness of breath.   Cardiovascular: Negative for chest pain and palpitations.  Gastrointestinal: Positive for heartburn. Negative for abdominal pain, blood in stool, constipation, diarrhea, melena and vomiting.  Genitourinary: Negative for dysuria and urgency.  Musculoskeletal: Negative for joint pain and myalgias.  Skin: Negative for itching and rash.  Neurological: Negative for dizziness and headaches.  Psychiatric/Behavioral: Negative for depression. The patient is not nervous/anxious.      Objective: There were no vitals taken for this visit. Physical Exam Constitutional:      Appearance: Normal appearance.  HENT:     Head: Normocephalic and atraumatic.  Eyes:     Extraocular Movements: Extraocular movements intact.     Conjunctiva/sclera: Conjunctivae normal.  Cardiovascular:     Rate and Rhythm: Normal rate and regular rhythm.  Pulmonary:     Effort: Pulmonary effort is normal.     Breath sounds: Normal breath sounds.  Abdominal:     General: Bowel sounds are normal.     Palpations: Abdomen is soft.  Musculoskeletal:        General: Normal range of motion.     Cervical  back: Normal range of motion and neck supple.  Skin:    General: Skin is warm.  Neurological:     General: No focal deficit present.     Mental Status: He is alert and oriented to person, place, and time.  Psychiatric:        Mood and Affect: Mood normal.        Behavior: Behavior normal.      Assessment: *Chronic reflux-well-controlled on pantoprazole daily *History of adenomatous polyps-colon recall 2024  Plan: Patient's reflux appears to be well controlled.  Will continue on pantoprazole daily. We will give instructions to patient regarding reflux practices at home to diminish his symptoms Colonoscopy recall 2024 due to history of adenomatous polyps. Patient to follow-up in 1 year or sooner if needed.  07/21/2020 10:53 AM   Disclaimer: This note was dictated with voice recognition software. Similar sounding words can inadvertently be transcribed and may not be corrected upon review.

## 2020-07-21 NOTE — Patient Instructions (Addendum)
Continue on Protonix 40 mg daily for your chronic reflux.  I sent in a year supply of refills today to your pharmacy.  We will plan on surveillance colonoscopy in 2024 due to history of colon polyps.  Follow-up in 1 year or sooner if needed.  Lifestyle and home remedies TO MANAGE REFLUX/HEARTBURN    You may eliminate or reduce the frequency of heartburn by making the following lifestyle changes:    Control your weight. Being overweight is a major risk factor for heartburn and GERD. Excess pounds put pressure on your abdomen, pushing up your stomach and causing acid to back up into your esophagus.     Eat smaller meals. 4 TO 6 MEALS A DAY. This reduces pressure on the lower esophageal sphincter, helping to prevent the valve from opening and acid from washing back into your esophagus.      Loosen your belt. Clothes that fit tightly around your waist put pressure on your abdomen and the lower esophageal sphincter.      Eliminate heartburn triggers. Everyone has specific triggers. Common triggers such as fatty or fried foods, spicy food, tomato sauce, carbonated beverages, alcohol, chocolate, mint, garlic, onion, caffeine and nicotine may make heartburn worse.     Avoid stooping or bending. Tying your shoes is OK. Bending over for longer periods to weed your garden isn't, especially soon after eating.     Don't lie down after a meal. Wait at least three to four hours after eating before going to bed, and don't lie down right after eating.   At [facility] we value your feedback. You may receive a survey about your visit today. Please share your experience as we strive to create trusting relationships with our patients to provide genuine, compassionate, quality care.  We appreciate your understanding and patience as we review any laboratory studies, imaging, and other diagnostic tests that are ordered as we care for you. Our office policy is 5 business days for review of  these results, and any emergent or urgent results are addressed in a timely manner for your best interest. If you do not hear from our office in 1 week, please contact us.   We also encourage the use of MyChart, which contains your medical information for your review as well. If you are not enrolled in this feature, an access code is on this after visit summary for your convenience. Thank you for allowing [place] to be involved in your care.  It was great to see you today!  I hope you have a great rest of your summer!!    Elon Alas. Abbey Chatters, D.O. Gastroenterology and Hepatology [facility]

## 2020-08-05 DIAGNOSIS — N401 Enlarged prostate with lower urinary tract symptoms: Secondary | ICD-10-CM | POA: Diagnosis not present

## 2020-08-05 DIAGNOSIS — R3915 Urgency of urination: Secondary | ICD-10-CM | POA: Diagnosis not present

## 2020-08-05 DIAGNOSIS — R3911 Hesitancy of micturition: Secondary | ICD-10-CM | POA: Diagnosis not present

## 2020-08-05 DIAGNOSIS — R3912 Poor urinary stream: Secondary | ICD-10-CM | POA: Diagnosis not present

## 2020-08-12 ENCOUNTER — Ambulatory Visit: Payer: Medicare Other | Admitting: Urology

## 2020-09-13 DIAGNOSIS — R3912 Poor urinary stream: Secondary | ICD-10-CM | POA: Diagnosis not present

## 2020-09-13 DIAGNOSIS — R972 Elevated prostate specific antigen [PSA]: Secondary | ICD-10-CM | POA: Diagnosis not present

## 2020-09-13 DIAGNOSIS — N401 Enlarged prostate with lower urinary tract symptoms: Secondary | ICD-10-CM | POA: Diagnosis not present

## 2020-09-15 ENCOUNTER — Ambulatory Visit: Payer: Medicare Other | Admitting: Urology

## 2020-09-23 DIAGNOSIS — M47812 Spondylosis without myelopathy or radiculopathy, cervical region: Secondary | ICD-10-CM | POA: Diagnosis not present

## 2020-10-13 DIAGNOSIS — D1801 Hemangioma of skin and subcutaneous tissue: Secondary | ICD-10-CM | POA: Diagnosis not present

## 2020-10-13 DIAGNOSIS — L821 Other seborrheic keratosis: Secondary | ICD-10-CM | POA: Diagnosis not present

## 2020-10-13 DIAGNOSIS — L918 Other hypertrophic disorders of the skin: Secondary | ICD-10-CM | POA: Diagnosis not present

## 2020-10-13 DIAGNOSIS — L570 Actinic keratosis: Secondary | ICD-10-CM | POA: Diagnosis not present

## 2020-10-13 DIAGNOSIS — L814 Other melanin hyperpigmentation: Secondary | ICD-10-CM | POA: Diagnosis not present

## 2020-10-13 DIAGNOSIS — D225 Melanocytic nevi of trunk: Secondary | ICD-10-CM | POA: Diagnosis not present

## 2020-11-29 ENCOUNTER — Other Ambulatory Visit: Payer: Self-pay | Admitting: Cardiovascular Disease

## 2021-05-18 NOTE — Progress Notes (Deleted)
Cardiology Office Note   Date:  05/18/2021   ID:  Terry Powell, DOB August 12, 1951, MRN [id number]  PCP:  Leeanne Rio, MD  Cardiologist:  Dr. Aundra Dubin    No chief complaint on file.     History of Present Illness: Terry Powell is a 70 y.o. male who presents for CAD and hyperlipidemia, HTN follow up.  First seen by me August 2018  He has a history of HTN and hyperlipidemia.  Seen by Dr. Aundra Dubin 01/2015.  In 06/18/15, patient developed severe substernal chest pain.  He went to the ER at [facility].  Cardiac enzymes were positive, so he was sent to [facility].  Left heart cath showed EF 55% and plaque + significant mobile thrombus in the proximal LAD creating about 80% stenosis.  He was treated with ticagrelor and heparin gtt.  Re-look cath several days later showed only  20-25% proximal LAD stenosis and resolution of the thrombus.  Ticagrelor was continued for a year then stopped.  He had a non ischemic myovue 02/2015    Lives alone Mother next to him is 13.  F/U myovue 11/04/18 no ischemia EF 57%  Echo 11/04/18 EF 60-65% no significant valve disease   Tends to run low HR mostly in mid 50's not on beta blocker ECG from primary reviewed asymptomatic but SB rate 46 with no AV block and otherwise normal 06/02/19   Chronic reflux Rx protonix Follows with GI previous polyp removal Due repeat colon 2024  ***    Past Medical History:  Diagnosis Date   Allergy    IV dye, seasonal   Anxiety    Arthritis    Blood transfusion without reported diagnosis    Cataract    Clotting disorder ([place])    Depression    since back surgery   GERD (gastroesophageal reflux disease) 04/30/2018   Gout    Hypercholesteremia    Hypertension    Myocardial infarction ([place])    Solitary kidney, congenital     Past Surgical History:  Procedure Laterality Date   ANKLE FRACTURE SURGERY     BACK SURGERY     BIOPSY  08/23/2018   Procedure: BIOPSY;  Surgeon: Danie Binder, MD;  [facility];   Service: Endoscopy;;  biopsy of cardia   COLONOSCOPY  06/08/2011   Dr. Oneida Alar: 2 sessile polyps removed from the descending colon.  One was lower adenoma.  Diverticular also seen.  Hemorrhoids.TCS recommended in 2017.    COLONOSCOPY N/A 08/23/2018   Procedure: COLONOSCOPY;  Surgeon: Danie Binder, MD;  [facility];  Service: Endoscopy;  Laterality: N/A;  12:00PM   ESOPHAGEAL DILATION  06/08/2011   Procedure: ESOPHAGEAL DILATION;  Surgeon: Dorothyann Peng, MD;  [facility];  Service: Endoscopy;;   ESOPHAGOGASTRODUODENOSCOPY  06/08/2011   Dr. Oneida Alar: Leftward duodenum, esophageal stricture status post dilation, hiatal hernia, gastritis/duodenitis..  No H. pylori on path.   ESOPHAGOGASTRODUODENOSCOPY N/A 08/23/2018   Procedure: ESOPHAGOGASTRODUODENOSCOPY (EGD);  Surgeon: Danie Binder, MD;  [facility];  Service: Endoscopy;  Laterality: N/A;   POLYPECTOMY  08/23/2018   Procedure: POLYPECTOMY;  Surgeon: Danie Binder, MD;  [facility];  Service: Endoscopy;;  polyp at ascending colon, descending colon polyps x2, rectal polyp,   SAVORY DILATION N/A 08/23/2018   Procedure: SAVORY DILATION;  Surgeon: Danie Binder, MD;  [facility];  Service: Endoscopy;  Laterality: N/A;   [place]   2-3  discs injured , fusion   TONSILLECTOMY     VASECTOMY       Current Outpatient Medications  Medication Sig Dispense Refill   aspirin EC 81 MG tablet Take 1 tablet (81 mg total) by mouth daily.     atorvastatin (LIPITOR) 80 MG tablet Take 1 tablet by mouth once daily 90 tablet 3   calcium carbonate (TUMS - DOSED IN MG ELEMENTAL CALCIUM) 500 MG chewable tablet Chew 1 tablet by mouth as needed for indigestion or heartburn.     cholecalciferol (VITAMIN D) 1000 units tablet Take 1,000 Units by mouth daily.     Colchicine (MITIGARE) 0.6 MG CAPS Take 2 pills at the first sign of gout.  Repeat dose at one hour Then one a day until gone (Patient taking  differently: Take 2 tablets by mouth See admin instructions. Take 2 pills at the first sign of gout.  Repeat dose at one hour Then one a day until gone) 30 capsule 11   docusate sodium (COLACE) 100 MG capsule Take 300 mg by mouth daily as needed for moderate constipation.      FLUoxetine (PROZAC) 20 MG capsule Take 20 mg by mouth daily.      losartan (COZAAR) 50 MG tablet Take 1 tablet by mouth once daily 90 tablet 3   nitroGLYCERIN (NITROSTAT) 0.4 MG SL tablet Place 1 tablet (0.4 mg total) under the tongue every 5 (five) minutes as needed. 20 tablet 2   pantoprazole (PROTONIX) 40 MG tablet TAKE 1 TABLET BY MOUTH ONCE DAILY 30  MINUTES  PRIOR  TO  THE  FIRST  MAIN  MEAL  OF  THE  DAY 90 tablet 3   polyethylene glycol (MIRALAX / GLYCOLAX) packet Take 17 g by mouth daily as needed.      No current facility-administered medications for this visit.    Allergies:   Contrast media [iodinated diagnostic agents]    Social History:  The patient  reports that he has never smoked. He has never used smokeless tobacco. He reports that he does not drink alcohol and does not use drugs.   Family History:  The patient's family history includes Arthritis in his father and mother; Asthma in his mother; COPD in his mother; Cancer (age of onset: 12) in his maternal grandfather; Colon cancer (age of onset: 20) in his maternal grandfather; Depression in his mother; Diabetes in his mother; Heart disease (age of onset: 54) in his father; Hyperlipidemia in his father and mother; Hypertension in his mother; Kidney disease in his mother; Stroke in his maternal grandfather and paternal grandfather; Ulcers in his mother; Ulcers (age of onset: 28) in his father.    ROS:  General:no colds or fevers, + weight increase from last year Skin:no rashes or ulcers HEENT:no blurred vision, no congestion CV:see HPI PUL:see HPI GI:no diarrhea constipation or melena, no indigestion GU:no hematuria, no dysuria MS:no joint pain, no  claudication Neuro:no syncope, no lightheadedness Endo:no diabetes, no thyroid disease  Wt Readings from Last 3 Encounters:  07/21/20 79.2 kg  05/26/20 78.5 kg  11/10/19 77.1 kg     PHYSICAL EXAM: VS:  There were no vitals taken for this visit. , BMI There is no height or weight on file to calculate BMI. Affect appropriate Healthy:  appears stated age 30: normal Neck supple with no adenopathy JVP normal no bruits no thyromegaly Lungs clear with no wheezing and good diaphragmatic motion Heart:  S1/S2 no murmur, no rub, gallop or click PMI normal Abdomen:  benighn, BS positve, no tenderness, no AAA no bruit.  No HSM or HJR Distal pulses intact with no bruits No edema Neuro non-focal Skin warm and dry No muscular weakness   EKG:  05/26/20 SB rate 58 otherwise normal    Recent Labs: No results found for requested labs within last 8760 hours.    Lipid Panel    Component Value Date/Time   CHOL 149 06/08/2017 1457   TRIG 177 (H) 06/08/2017 1457   HDL 39 (L) 06/08/2017 1457   CHOLHDL 3.8 06/08/2017 1457   CHOLHDL 3.8 06/29/2016 0944   VLDL 31 (H) 06/29/2016 0944   LDLCALC 75 06/08/2017 1457     Other studies Reviewed: Additional studies/ records that were reviewed today include: Notes Dr Aundra Dubin and PA Cecilie Kicks Myovue 2016 And cath films from 2016  Nuc study 02/2015 .  Overall Impression:  Low risk stress nuclear study with a small, mild, partially reversible inferior defect consistent with inferior thinning and possible minimal inferior ischemia.   LV Ejection Fraction: 54%.  LV Wall Motion:  NL LV Function; NL Wall Motion  Nuc study 11/04/18   Probable normal perfusion and mild soft tissue attenuation (diaphragm) No significant ischemia or scar There was no ST segment deviation noted during stress. Nuclear stress EF: 57%. This is a low risk study.  Echo: 11/04/18  Study Conclusions   - Left ventricle: The cavity size was normal. Wall thickness was    increased in a pattern of mild LVH. Systolic function was normal.   The estimated ejection fraction was in the range of 60% to 65%.   The study is not technically sufficient to allow evaluation of LV   diastolic function.   ASSESSMENT AND PLAN:   1. CAD:  Thrombotic lesion LAD 2012 resolved with anticoagulation. Normal myovue 2016 and again 11/04/18 with no ischemia and EF 57%   stable no beta blocker due to bradycardia continue ASA and statin     2. Bradycardia: Asymptomatic, good chronotropic response to exercise Avoid beta blockers   3. Hyperlipidemia: at goal on statin labs with primary   4. Renal:  History of solitary kidney and enlarged prostate Baseline Cr around 1.25 labs with primary   5. GI:  continue protonix for reflux f/u GI arrange colonoscopy 2024 previous polyp removal   F/U in a year   Jenkins Rouge, MD

## 2021-05-26 ENCOUNTER — Ambulatory Visit: Payer: Medicare Other | Admitting: Cardiovascular Disease

## 2021-06-01 NOTE — Progress Notes (Signed)
Cardiology Office Note   Date:  06/07/2021   ID:  Terry Powell, DOB Mar 19, 1951, MRN [id number]  PCP:  Terry Rio, MD  Cardiologist:  Dr. Aundra Powell    No chief complaint on file.     History of Present Illness: Terry Powell is a 70 y.o. male who presents for CAD and hyperlipidemia, HTN follow up.  First seen by me August 2018  He has a history of HTN and hyperlipidemia.  Seen by Dr. Aundra Powell 01/2015.  In 06/18/15, patient developed severe substernal chest pain.  He went to the ER at [facility].  Cardiac enzymes were positive, so he was sent to [facility].  Left heart cath showed EF 55% and plaque + significant mobile thrombus in the proximal LAD creating about 80% stenosis.  He was treated with ticagrelor and heparin gtt.  Re-look cath several days later showed only  20-25% proximal LAD stenosis and resolution of the thrombus.  Ticagrelor was continued for a year then stopped.  He had a non ischemic myovue 02/2015    Lives alone Mother next to him is 70  F/U myovue 11/04/18 no ischemia EF 57%  Echo 11/04/18 EF 60-65% no significant valve disease   Tends to run low HR mostly in mid 50's not on beta blocker ECG from primary reviewed asymptomatic but SB rate 46 with no AV block and otherwise normal 06/02/19   Chronic reflux Rx protonix Follows with GI previous polyp removal Due repeat colon 2024   He has been quite sedentary and discussed going to [facility] to walk on treadmill more     Past Medical History:  Diagnosis Date   Allergy    IV dye, seasonal   Anxiety    Arthritis    Blood transfusion without reported diagnosis    Cataract    Clotting disorder ([place])    Depression    since back surgery   GERD (gastroesophageal reflux disease) 04/30/2018   Gout    Hypercholesteremia    Hypertension    Myocardial infarction ([place])    Solitary kidney, congenital     Past Surgical History:  Procedure Laterality Date   ANKLE FRACTURE SURGERY     BACK SURGERY     BIOPSY  08/23/2018    Procedure: BIOPSY;  Surgeon: Danie Binder, MD;  [facility];  Service: Endoscopy;;  biopsy of cardia   COLONOSCOPY  06/08/2011   Dr. Oneida Alar: 2 sessile polyps removed from the descending colon.  One was lower adenoma.  Diverticular also seen.  Hemorrhoids.TCS recommended in 2017.    COLONOSCOPY N/A 08/23/2018   Procedure: COLONOSCOPY;  Surgeon: Danie Binder, MD;  [facility];  Service: Endoscopy;  Laterality: N/A;  12:00PM   ESOPHAGEAL DILATION  06/08/2011   Procedure: ESOPHAGEAL DILATION;  Surgeon: Dorothyann Peng, MD;  [facility];  Service: Endoscopy;;   ESOPHAGOGASTRODUODENOSCOPY  06/08/2011   Dr. Oneida Alar: Leftward duodenum, esophageal stricture status post dilation, hiatal hernia, gastritis/duodenitis..  No H. pylori on path.   ESOPHAGOGASTRODUODENOSCOPY N/A 08/23/2018   Procedure: ESOPHAGOGASTRODUODENOSCOPY (EGD);  Surgeon: Danie Binder, MD;  [facility];  Service: Endoscopy;  Laterality: N/A;   POLYPECTOMY  08/23/2018   Procedure: POLYPECTOMY;  Surgeon: Danie Binder, MD;  [facility];  Service: Endoscopy;;  polyp at ascending colon, descending colon polyps x2, rectal polyp,   SAVORY DILATION N/A 08/23/2018   Procedure: SAVORY DILATION;  Surgeon: Danie Binder, MD;  [facility];  Service: Endoscopy;  Laterality: N/A;   [place]   2-3 discs injured , fusion   TONSILLECTOMY     VASECTOMY       Current Outpatient Medications  Medication Sig Dispense Refill   aspirin EC 81 MG tablet Take 1 tablet (81 mg total) by mouth daily.     atorvastatin (LIPITOR) 80 MG tablet Take 1 tablet by mouth once daily 90 tablet 3   calcium carbonate (TUMS - DOSED IN MG ELEMENTAL CALCIUM) 500 MG chewable tablet Chew 1 tablet by mouth as needed for indigestion or heartburn.     cholecalciferol (VITAMIN D) 1000 units tablet Take 1,000 Units by mouth daily.     Colchicine (MITIGARE) 0.6 MG CAPS Take 2 pills at the first  sign of gout.  Repeat dose at one hour Then one a day until gone (Patient taking differently: Take 2 tablets by mouth See admin instructions. Take 2 pills at the first sign of gout.  Repeat dose at one hour Then one a day until gone) 30 capsule 11   docusate sodium (COLACE) 100 MG capsule Take 300 mg by mouth daily as needed for moderate constipation.      FLUoxetine (PROZAC) 20 MG capsule Take 20 mg by mouth daily.      losartan (COZAAR) 50 MG tablet Take 1 tablet by mouth once daily 90 tablet 3   nitroGLYCERIN (NITROSTAT) 0.4 MG SL tablet Place 1 tablet (0.4 mg total) under the tongue every 5 (five) minutes as needed. 20 tablet 2   pantoprazole (PROTONIX) 40 MG tablet TAKE 1 TABLET BY MOUTH ONCE DAILY 30  MINUTES  PRIOR  TO  THE  FIRST  MAIN  MEAL  OF  THE  DAY 90 tablet 3   polyethylene glycol (MIRALAX / GLYCOLAX) packet Take 17 g by mouth daily as needed.      No current facility-administered medications for this visit.    Allergies:   Iodinated diagnostic agents    Social History:  The patient  reports that he has never smoked. He has never used smokeless tobacco. He reports that he does not drink alcohol and does not use drugs.   Family History:  The patient's family history includes Arthritis in his father and mother; Asthma in his mother; COPD in his mother; Cancer (age of onset: 33) in his maternal grandfather; Colon cancer (age of onset: 69) in his maternal grandfather; Depression in his mother; Diabetes in his mother; Heart disease (age of onset: 33) in his father; Hyperlipidemia in his father and mother; Hypertension in his mother; Kidney disease in his mother; Stroke in his maternal grandfather and paternal grandfather; Ulcers in his mother; Ulcers (age of onset: 31) in his father.    ROS:  General:no colds or fevers, + weight increase from last year Skin:no rashes or ulcers HEENT:no blurred vision, no congestion CV:see HPI PUL:see HPI GI:no diarrhea constipation or melena, no  indigestion GU:no hematuria, no dysuria MS:no joint pain, no claudication Neuro:no syncope, no lightheadedness Endo:no diabetes, no thyroid disease  Wt Readings from Last 3 Encounters:  06/07/21 75.8 kg  07/21/20 79.2 kg  05/26/20 78.5 kg     PHYSICAL EXAM: VS:  Ht '5\' 9"'$  (1.753 m)   Wt 75.8 kg   BMI 24.66 kg/m  , BMI Body mass index is 24.66 kg/m. Affect appropriate Healthy:  appears stated age 57: normal Neck supple with no adenopathy JVP normal no bruits no thyromegaly Lungs clear with no wheezing  and good diaphragmatic motion Heart:  S1/S2 no murmur, no rub, gallop or click PMI normal Abdomen: benighn, BS positve, no tenderness, no AAA no bruit.  No HSM or HJR Distal pulses intact with no bruits No edema Neuro non-focal Skin warm and dry No muscular weakness   EKG:  05/26/20 SB rate 58 otherwise normal 06/07/2021 SR rate 59 normal    Recent Labs: No results found for requested labs within last 8760 hours.    Lipid Panel    Component Value Date/Time   CHOL 149 06/08/2017 1457   TRIG 177 (H) 06/08/2017 1457   HDL 39 (L) 06/08/2017 1457   CHOLHDL 3.8 06/08/2017 1457   CHOLHDL 3.8 06/29/2016 0944   VLDL 31 (H) 06/29/2016 0944   LDLCALC 75 06/08/2017 1457     Other studies Reviewed: Additional studies/ records that were reviewed today include: Notes Dr Terry Powell and PA Cecilie Kicks Myovue 2016 And cath films from 2016  Nuc study 02/2015 .  Overall Impression:  Low risk stress nuclear study with a small, mild, partially reversible inferior defect consistent with inferior thinning and possible minimal inferior ischemia.   LV Ejection Fraction: 54%.  LV Wall Motion:  NL LV Function; NL Wall Motion  Nuc study 11/04/18   Probable normal perfusion and mild soft tissue attenuation (diaphragm) No significant ischemia or scar There was no ST segment deviation noted during stress. Nuclear stress EF: 57%. This is a low risk study.  Echo: 11/04/18  Study  Conclusions   - Left ventricle: The cavity size was normal. Wall thickness was   increased in a pattern of mild LVH. Systolic function was normal.   The estimated ejection fraction was in the range of 60% to 65%.   The study is not technically sufficient to allow evaluation of LV   diastolic function.   ASSESSMENT AND PLAN:   1. CAD:  Thrombotic lesion LAD 2012 resolved with anticoagulation. Normal myovue 2016 and again 11/04/18 with no ischemia and EF 57%   stable no beta blocker due to bradycardia continue ASA and statin     2. Bradycardia: Asymptomatic, good chronotropic response to exercise Avoid beta blockers   3. Hyperlipidemia: at goal on statin labs with primary   4. Renal:  History of solitary kidney and enlarged prostate Baseline Cr around 1.25 labs with primary   5. GI:  continue protonix for reflux f/u GI arrange colonoscopy 2024 previous polyp removal   F/U in a year   Jenkins Rouge, MD

## 2021-06-07 ENCOUNTER — Other Ambulatory Visit: Payer: Self-pay

## 2021-06-07 ENCOUNTER — Ambulatory Visit (INDEPENDENT_AMBULATORY_CARE_PROVIDER_SITE_OTHER): Payer: Medicare Other | Admitting: Cardiovascular Disease

## 2021-06-07 ENCOUNTER — Encounter: Payer: Self-pay | Admitting: Cardiovascular Disease

## 2021-06-07 VITALS — BP 142/78 | HR 54 | Ht 69.0 in | Wt 167.0 lb

## 2021-06-07 DIAGNOSIS — I2510 Atherosclerotic heart disease of native coronary artery without angina pectoris: Secondary | ICD-10-CM

## 2021-06-07 DIAGNOSIS — R001 Bradycardia, unspecified: Secondary | ICD-10-CM

## 2021-06-07 DIAGNOSIS — E782 Mixed hyperlipidemia: Secondary | ICD-10-CM

## 2021-06-07 NOTE — Patient Instructions (Signed)
Medication Instructions:  Your physician recommends that you continue on your current medications as directed. Please refer to the Current Medication list given to you today.  *If you need a refill on your cardiac medications before your next appointment, please call your pharmacy*   Lab Work: NONE   If you have labs (blood work) drawn today and your tests are completely normal, you will receive your results only by: . MyChart Message (if you have MyChart) OR . A paper copy in the mail If you have any lab test that is abnormal or we need to change your treatment, we will call you to review the results.   Testing/Procedures: NONE    Follow-Up: At [facility], you and your health needs are our priority.  As part of our continuing mission to provide you with exceptional heart care, we have created designated Provider Care Teams.  These Care Teams include your primary Cardiologist (physician) and Advanced Practice Providers (APPs -  Physician Assistants and Nurse Practitioners) who all work together to provide you with the care you need, when you need it.  We recommend signing up for the patient portal called "MyChart".  Sign up information is provided on this After Visit Summary.  MyChart is used to connect with patients for Virtual Visits (Telemedicine).  Patients are able to view lab/test results, encounter notes, upcoming appointments, etc.  Non-urgent messages can be sent to your provider as well.   To learn more about what you can do with MyChart, go to [email or web address].    Your next appointment:   1 year(s)  The format for your next appointment:   In Person  Provider:   Peter Nishan, MD   Other Instructions Thank you for choosing [facility]!    

## 2021-06-27 ENCOUNTER — Encounter: Payer: Self-pay | Admitting: Internal Medicine

## 2021-07-18 ENCOUNTER — Other Ambulatory Visit: Payer: Self-pay | Admitting: Internal Medicine

## 2021-09-11 ENCOUNTER — Other Ambulatory Visit: Payer: Self-pay | Admitting: Cardiovascular Disease

## 2022-02-22 ENCOUNTER — Telehealth: Payer: Self-pay | Admitting: *Deleted

## 2022-02-22 ENCOUNTER — Ambulatory Visit (INDEPENDENT_AMBULATORY_CARE_PROVIDER_SITE_OTHER): Payer: Medicare Other | Admitting: Neurology

## 2022-02-22 ENCOUNTER — Encounter: Payer: Self-pay | Admitting: Neurology

## 2022-02-22 VITALS — Ht 69.0 in | Wt 172.0 lb

## 2022-02-22 DIAGNOSIS — M542 Cervicalgia: Secondary | ICD-10-CM | POA: Diagnosis not present

## 2022-02-22 DIAGNOSIS — M5412 Radiculopathy, cervical region: Secondary | ICD-10-CM | POA: Diagnosis not present

## 2022-02-22 DIAGNOSIS — H6123 Impacted cerumen, bilateral: Secondary | ICD-10-CM

## 2022-02-22 DIAGNOSIS — R42 Dizziness and giddiness: Secondary | ICD-10-CM | POA: Diagnosis not present

## 2022-02-22 MED ORDER — CYCLOBENZAPRINE HCL 10 MG PO TABS: 10.0000 mg | ORAL_TABLET | Freq: Three times a day (TID) | ORAL | 3 refills | Status: AC | PRN

## 2022-02-22 MED ORDER — MECLIZINE HCL 12.5 MG PO TABS: 12.5000 mg | ORAL_TABLET | Freq: Three times a day (TID) | ORAL | 0 refills | Status: DC | PRN

## 2022-02-22 NOTE — Telephone Encounter (Signed)
PA for cyclobenzaprine '10mg'$  started on covermymeds (key: [id number]). Pharmacy coverage through [place] [phone] XU). Waiting for plan to respond with provider questions. ?

## 2022-02-22 NOTE — Telephone Encounter (Signed)
Questions completed. Case [id number] approved through 02/22/23. ?

## 2022-02-22 NOTE — Patient Instructions (Addendum)
Continue current medications  ?Start with Flexeril as needed for neck pain/spasms  ?Start with Meclizine as needed for dizziness  ?MRI Cervical spine  ?Referral to physical therapy for ongoing neck pain  ?Follow up with audiology to have ear wax removed ?Return in 6 months  ?

## 2022-02-22 NOTE — Progress Notes (Signed)
? ?[facility] ? ?PATIENT: Terry Powell ?DOB: 1951-04-22 ? ?REQUESTING CLINICIAN: Leeanne Rio, MD ?HISTORY FROM: Patient  ?REASON FOR VISIT: Neck pain, dizziness  ? ? ?HISTORICAL ? ?CHIEF COMPLAINT:  ?Chief Complaint  ?Patient presents with  ? New Patient (Initial Visit)  ?  Rm 13. Alone. ?NP/Paper/[facility]/Althea Rucker MD/dizziness and confusion.  ? ? ?HISTORY OF PRESENT ILLNESS:  ?This is a 71 year old gentleman past medical history of coronary artery disease, hypertension who is presenting with complaint of back of the head/neck pain.  Patient reported history of bone spur and has been having neck pain for many years.  He reports in the past he used to have steroid injection with improvement of his symptoms but have not had any injection for the past couple years.  Pain is worsened with range of motion of the neck.  He has been taking Tylenol for the pain.   ?On top of that he also complaining of lightheadedness described as feeling unsteady and room spinning sensation, denies any fall.  He does have a history of cerumen impaction and says that every year he will go to his audiologist to have the wax removed but have not done so in more than a year.  He is also complaining of tinnitus and tinnitus worse on the left ear.  No hearing loss ? ? ?OTHER MEDICAL CONDITIONS: CAD, Hypertension,  ? ? ?REVIEW OF SYSTEMS: Full 14 system review of systems performed and negative with exception of: as noted in the HPI  ? ?ALLERGIES: ?Allergies  ?Allergen Reactions  ? Iodinated Contrast Media Shortness Of Breath, Other (See Comments) and Anaphylaxis  ?  Shakes. Evaluated and treated in ED. 1980s.  ?Patient has one kidney. ?Shakes. Evaluated and treated in ED. 1980s.  ?Patient has one kidney. ?  ? ? ?HOME MEDICATIONS: ?Outpatient Medications Prior to Visit  ?Medication Sig Dispense Refill  ? aspirin EC 81 MG tablet Take 1 tablet (81 mg total) by mouth daily.    ? atorvastatin (LIPITOR) 80 MG  tablet Take 1 tablet by mouth once daily 90 tablet 3  ? calcium carbonate (TUMS - DOSED IN MG ELEMENTAL CALCIUM) 500 MG chewable tablet Chew 1 tablet by mouth as needed for indigestion or heartburn.    ? cholecalciferol (VITAMIN D) 1000 units tablet Take 1,000 Units by mouth daily.    ? Colchicine (MITIGARE) 0.6 MG CAPS Take 2 pills at the first sign of gout.  Repeat dose at one hour Then one a day until gone (Patient taking differently: Take 2 tablets by mouth See admin instructions. Take 2 pills at the first sign of gout.  Repeat dose at one hour Then one a day until gone) 30 capsule 11  ? docusate sodium (COLACE) 100 MG capsule Take 300 mg by mouth daily as needed for moderate constipation.     ? FLUoxetine (PROZAC) 20 MG capsule Take 20 mg by mouth daily.     ? losartan (COZAAR) 50 MG tablet Take 1 tablet by mouth once daily 90 tablet 3  ? nitroGLYCERIN (NITROSTAT) 0.4 MG SL tablet Place 1 tablet (0.4 mg total) under the tongue every 5 (five) minutes as needed. 20 tablet 2  ? pantoprazole (PROTONIX) 40 MG tablet TAKE 1 TABLET BY MOUTH ONCE DAILY 30  MINUTES  PRIOR  TO  THE  FIRST  MAIN  MEAL  OF  THE  DAY 90 tablet 3  ? polyethylene glycol (MIRALAX / GLYCOLAX) packet Take 17 g by mouth  daily as needed.     ? ?No facility-administered medications prior to visit.  ? ? ?PAST MEDICAL HISTORY: ?Past Medical History:  ?Diagnosis Date  ? Allergy   ? IV dye, seasonal  ? Anxiety   ? Arthritis   ? Blood transfusion without reported diagnosis   ? Cataract   ? Clotting disorder ([place])   ? Depression   ? since back surgery  ? GERD (gastroesophageal reflux disease) 04/30/2018  ? Gout   ? Hypercholesteremia   ? Hypertension   ? Myocardial infarction [facility])   ? Solitary kidney, congenital   ? ? ?PAST SURGICAL HISTORY: ?Past Surgical History:  ?Procedure Laterality Date  ? ANKLE FRACTURE SURGERY    ? BACK SURGERY    ? BIOPSY  08/23/2018  ? Procedure: BIOPSY;  Surgeon: Danie Binder, MD;  [facility];  Service:  Endoscopy;;  biopsy of cardia  ? COLONOSCOPY  06/08/2011  ? Dr. Oneida Alar: 2 sessile polyps removed from the descending colon.  One was lower adenoma.  Diverticular also seen.  Hemorrhoids.TCS recommended in 2017.   ? COLONOSCOPY N/A 08/23/2018  ? Procedure: COLONOSCOPY;  Surgeon: Danie Binder, MD;  [facility];  Service: Endoscopy;  Laterality: N/A;  12:00PM  ? ESOPHAGEAL DILATION  06/08/2011  ? Procedure: ESOPHAGEAL DILATION;  Surgeon: Dorothyann Peng, MD;  [facility];  Service: Endoscopy;;  ? ESOPHAGOGASTRODUODENOSCOPY  06/08/2011  ? Dr. Oneida Alar: Leftward duodenum, esophageal stricture status post dilation, hiatal hernia, gastritis/duodenitis..  No H. pylori on path.  ? ESOPHAGOGASTRODUODENOSCOPY N/A 08/23/2018  ? Procedure: ESOPHAGOGASTRODUODENOSCOPY (EGD);  Surgeon: Danie Binder, MD;  [facility];  Service: Endoscopy;  Laterality: N/A;  ? POLYPECTOMY  08/23/2018  ? Procedure: POLYPECTOMY;  Surgeon: Danie Binder, MD;  [facility];  Service: Endoscopy;;  polyp at ascending colon, descending colon polyps x2, rectal polyp,  ? SAVORY DILATION N/A 08/23/2018  ? Procedure: SAVORY DILATION;  Surgeon: Danie Binder, MD;  [facility];  Service: Endoscopy;  Laterality: N/A;  ? [place]  ? 2-3 discs injured , fusion  ? TONSILLECTOMY    ? VASECTOMY    ? ? ?FAMILY HISTORY: ?Family History  ?Problem Relation Age of Onset  ? Ulcers Father 89  ?     partial gastrectomy  ? Heart disease Father 72  ? Arthritis Father   ? Hyperlipidemia Father   ? Ulcers Mother   ?     stomach  ? Arthritis Mother   ? Asthma Mother   ? COPD Mother   ? Depression Mother   ? Diabetes Mother   ? Hyperlipidemia Mother   ? Hypertension Mother   ? Kidney disease Mother   ? Colon cancer Maternal Grandfather 80  ? Stroke Maternal Grandfather   ? Cancer Maternal Grandfather 39  ?     colon  ? Stroke Paternal Grandfather   ? Liver disease Neg Hx   ? ? ?SOCIAL HISTORY: ?Social History   ? ?Socioeconomic History  ? Marital status: Single  ?  Spouse name: Not on file  ? Number of children: 2  ? Years of education: 69  ? Highest education level: Not on file  ?Occupational History  ? Occupation: retired  ?  Comment: state trooper  ?Tobacco Use  ? Smoking status: Never  ? Smokeless tobacco: Never  ?Vaping Use  ? Vaping Use: Never used  ?Substance and Sexual Activity  ? Alcohol use:  No  ? Drug use: No  ? Sexual activity: Yes  ?  Birth control/protection: Surgical  ?Other Topics Concern  ? Not on file  ?Social History Narrative  ? Retired Emergency planning/management officer  ? Lives alone  ? 2 daughters - 2 grandsons  ? Lives on [place]  ? Works on [employer]  ? ?Social Determinants of Health  ? ?Financial Resource Strain: Not on file  ?Food Insecurity: Not on file  ?Transportation Needs: Not on file  ?Physical Activity: Not on file  ?Stress: Not on file  ?Social Connections: Not on file  ?Intimate Partner Violence: Not on file  ? ? ?PHYSICAL EXAM ? ?GENERAL EXAM/CONSTITUTIONAL: ?Vitals:  ?Vitals:  ? 02/22/22 1416  ?Weight: 172 lb (78 kg)  ?Height: '5\' 9"'$  (1.753 m)  ? ?Body mass index is 25.4 kg/m?. ?Wt Readings from Last 3 Encounters:  ?02/22/22 172 lb (78 kg)  ?06/07/21 167 lb (75.8 kg)  ?07/21/20 174 lb 9.6 oz (79.2 kg)  ?Orthostatic VS for the past 72 hrs (Last 3 readings): ? Orthostatic BP Patient Position BP Location Orthostatic Pulse  ?02/22/22 1420 122/89 Standing Right Arm 73  ?02/22/22 1418 129/81 Supine Right Arm (!) 48  ?02/22/22 1416 133/82 Sitting Right Arm (!) 49  ? ? ?Patient is in no distress; well developed, nourished and groomed; neck is supple ?Bilateral cerumen impaction, left worse than right  ? ? ?EYES: ?Pupils round and reactive to light, Visual fields full to confrontation, Extraocular movements intacts,  ? ?MUSCULOSKELETAL: ?Gait, strength, tone, movements noted in Neurologic exam below ? ?NEUROLOGIC: ?MENTAL STATUS:  ?   ? View : No data to display.  ?  ?  ?  ? ?awake, alert, oriented to person,  place and time ?recent and remote memory intact ?normal attention and concentration ?language fluent, comprehension intact, naming intact ?fund of knowledge appropriate ? ?CRANIAL NERVE:  ?2nd - no papilledema or

## 2022-03-01 ENCOUNTER — Telehealth: Payer: Self-pay | Admitting: Neurology

## 2022-03-01 NOTE — Telephone Encounter (Signed)
Medicare/BCBS auth: [employer] Spoke to Raphael Gibney ref # [id number] order sent to GI. They will reach out to the patient to schedule.  ?

## 2022-03-11 ENCOUNTER — Ambulatory Visit
Admission: RE | Admit: 2022-03-11 | Discharge: 2022-03-11 | Disposition: A | Discharge status: Home / Self Care | Payer: Medicare Other | Source: Ambulatory Visit | Attending: Neurology | Admitting: Neurology

## 2022-03-11 DIAGNOSIS — M5412 Radiculopathy, cervical region: Secondary | ICD-10-CM

## 2022-03-13 ENCOUNTER — Telehealth: Payer: Self-pay | Admitting: Neurology

## 2022-03-13 NOTE — Telephone Encounter (Signed)
Called the pt and reviewed the MRI results from the neck. Advised of the finding and recommendation made by Dr April Manson. Patient has not started PT yet. He will start that and contact us if that doesn't help. Pt verbalized understanding of the results and was appreciative for the call.  ?

## 2022-03-13 NOTE — Telephone Encounter (Signed)
-----   Message from Alric Ran, MD sent at 03/13/2022  8:36 AM EDT ----- ?Please call and advise the patient that the recent MRI cervical spine shows bilateral foraminal stenosis (narrowing of part of your spine) causing the pain which is stable from MRI in 2020. If the pain is not controlled after physical therapy, we can refer him to neurosurgery for evaluation.  ? ?Thanks,  ?Dr. April Manson  ? ? ?  ?

## 2022-03-13 NOTE — Progress Notes (Signed)
Please call and advise the patient that the recent MRI cervical spine shows bilateral foraminal stenosis (narrowing of part of your spine) causing the pain which is stable from MRI in 2020. If the pain is not controlled after physical therapy, we can refer him to neurosurgery for evaluation.  ? ?Thanks,  ?Dr. April Manson  ? ? ?

## 2022-05-29 ENCOUNTER — Other Ambulatory Visit: Payer: Self-pay

## 2022-05-29 MED ORDER — NITROGLYCERIN 0.4 MG SL SUBL: 0.4000 mg | SUBLINGUAL_TABLET | SUBLINGUAL | 2 refills | Status: AC | PRN

## 2022-07-24 ENCOUNTER — Other Ambulatory Visit: Payer: Self-pay | Admitting: Gastroenterology

## 2022-07-25 NOTE — Telephone Encounter (Signed)
LMOM for patient to call me back to schedule OV or Virtual Visit so he can further his refills.

## 2022-07-28 ENCOUNTER — Telehealth: Payer: Medicare Other | Admitting: Gastroenterology

## 2022-07-31 ENCOUNTER — Encounter: Payer: Self-pay | Admitting: Internal Medicine

## 2022-07-31 NOTE — Telephone Encounter (Signed)
No reply from patient. Mailed him a letter to call the office to schedule OV/VV to further refills

## 2022-08-07 ENCOUNTER — Encounter: Payer: Self-pay | Admitting: Gastroenterology

## 2022-08-07 NOTE — Progress Notes (Deleted)
Primary Care Physician:  Leeanne Rio, MD  Primary GI: Dr. Abbey Chatters  Patient Location: Home   Provider Location: [facility] office   Reason for Visit: Follow-up, medication refill   Persons present on the virtual encounter, with roles: Aliene Altes, PA-C (Provider), Terry Powell (patient)   Total time (minutes) spent on medical discussion: ### minutes  Virtual Visit via *** Note Due to COVID-19, visit is conducted virtually and was requested by patient.   I connected with Celestia Khat on 08/07/22 at 11:30 AM EDT by *** and verified that I am speaking with the correct person using two identifiers.   I discussed the limitations, risks, security and privacy concerns of performing an evaluation and management service by *** and the availability of in person appointments. I also discussed with the patient that there may be a patient responsible charge related to this service. The patient expressed understanding and agreed to proceed.  No chief complaint on file.    History of Present Illness: Terry Powell is a 71 year old male presenting today for follow-up.  He has history of chronic constipation, GERD, dysphagia in setting of esophageal stricture s/p dilation in 2012 in 2019, colon polyps with last colonoscopy in 2019 with recommendations to repeat in 5 years.  Last seen in our office 07/21/2020.  Reflux well controlled on pantoprazole 40 mg daily.  No other significant GI symptoms.  Recommended continuing Protonix daily, follow-up in 1 year or sooner if needed.  Today:    Past Medical History:  Diagnosis Date   Allergy    IV dye, seasonal   Anxiety    Arthritis    Blood transfusion without reported diagnosis    Cataract    Clotting disorder (HCC)    Depression    since back surgery   GERD (gastroesophageal reflux disease) 04/30/2018   Gout    Hypercholesteremia    Hypertension    Myocardial infarction ([place])    Solitary kidney, congenital      Past Surgical  History:  Procedure Laterality Date   ANKLE FRACTURE SURGERY     BACK SURGERY     BIOPSY  08/23/2018   Procedure: BIOPSY;  Surgeon: Danie Binder, MD;  [facility];  Service: Endoscopy;;  biopsy of cardia   COLONOSCOPY  06/08/2011   Dr. Oneida Alar: 2 sessile polyps removed from the descending colon.  One was lower adenoma.  Diverticular also seen.  Hemorrhoids.TCS recommended in 2017.    COLONOSCOPY N/A 08/23/2018   Procedure: COLONOSCOPY;  Surgeon: Danie Binder, MD;  [facility];  Service: Endoscopy;  Laterality: N/A;  12:00PM   ESOPHAGEAL DILATION  06/08/2011   Procedure: ESOPHAGEAL DILATION;  Surgeon: Dorothyann Peng, MD;  [facility];  Service: Endoscopy;;   ESOPHAGOGASTRODUODENOSCOPY  06/08/2011   Dr. Oneida Alar: Leftward duodenum, esophageal stricture status post dilation, hiatal hernia, gastritis/duodenitis..  No H. pylori on path.   ESOPHAGOGASTRODUODENOSCOPY N/A 08/23/2018   Procedure: ESOPHAGOGASTRODUODENOSCOPY (EGD);  Surgeon: Danie Binder, MD;  [facility];  Service: Endoscopy;  Laterality: N/A;   POLYPECTOMY  08/23/2018   Procedure: POLYPECTOMY;  Surgeon: Danie Binder, MD;  [facility];  Service: Endoscopy;;  polyp at ascending colon, descending colon polyps x2, rectal polyp,   SAVORY DILATION N/A 08/23/2018   Procedure: SAVORY DILATION;  Surgeon: Danie Binder, MD;  [facility];  Service: Endoscopy;  Laterality: N/A;   SPINE SURGERY  1996   2-3 discs injured ,  fusion   TONSILLECTOMY     VASECTOMY       No outpatient medications have been marked as taking for the 08/09/22 encounter (Appointment) with Erenest Rasher, PA-C.     Family History  Problem Relation Age of Onset   Ulcers Father 47       partial gastrectomy   Heart disease Father 50   Arthritis Father    Hyperlipidemia Father    Ulcers Mother        stomach   Arthritis Mother    Asthma Mother    COPD Mother    Depression Mother     Diabetes Mother    Hyperlipidemia Mother    Hypertension Mother    Kidney disease Mother    Colon cancer Maternal Grandfather 25   Stroke Maternal Grandfather    Cancer Maternal Grandfather 80       colon   Stroke Paternal Grandfather    Liver disease Neg Hx     Social History   Socioeconomic History   Marital status: Single    Spouse name: Not on file   Number of children: 2   Years of education: 16   Highest education level: Not on file  Occupational History   Occupation: retired    Comment: state trooper  Tobacco Use   Smoking status: Never   Smokeless tobacco: Never  Vaping Use   Vaping Use: Never used  Substance and Sexual Activity   Alcohol use: No   Drug use: No   Sexual activity: Yes    Birth control/protection: Surgical  Other Topics Concern   Not on file  Social History Narrative   Retired Emergency planning/management officer   Lives alone   2 daughters - 2 grandsons   [place] [address] [place]   Works on [place] Determinants of Radio broadcast assistant Strain: Not on [employer] Insecurity: Not on file  Transportation Needs: Not on file  Physical Activity: Not on file  Stress: Not on file  Social Connections: Not on file       Review of Systems: Gen: Denies fever, chills, anorexia. Denies fatigue, weakness, weight loss.  CV: Denies chest pain, palpitations, syncope, peripheral edema, and claudication. Resp: Denies dyspnea at rest, cough, wheezing, coughing up blood, and pleurisy. GI: see HPI Derm: Denies rash, itching, dry skin Psych: Denies depression, anxiety, memory loss, confusion. No homicidal or suicidal ideation.  Heme: Denies bruising, bleeding, and enlarged lymph nodes.  Observations/Objective: No distress. Alert and oriented. Pleasant. Well nourished. Normal mood and affect. Unable to perform complete physical exam due to *** encounter. No video available. ***   Assessment:     Plan: ***     I discussed the assessment and treatment plan  with the patient. The patient was provided an opportunity to ask questions and all were answered. The patient agreed with the plan and demonstrated an understanding of the instructions.   The patient was advised to call back or seek an in-person evaluation if the symptoms worsen or if the condition fails to improve as anticipated.  I provided *** minutes of non***-face-to-face time during this encounter.  Aliene Altes, PA-C [facility]  08/09/2022

## 2022-08-09 ENCOUNTER — Telehealth: Payer: Medicare Other | Admitting: Gastroenterology

## 2022-08-21 ENCOUNTER — Encounter: Payer: Self-pay | Admitting: Gastroenterology

## 2022-08-21 NOTE — Progress Notes (Signed)
Referring Provider: No ref. provider found Primary Care Physician:  Suzan Slick, MD Primary GI Physician: Dr. Marletta Lor  Chief Complaint  Patient presents with   Follow-up    Refill on medications     HPI:   Terry Powell is a 71 y.o. male presenting today for follow-up/need for medication refills.  He has history of chronic constipation, GERD, dysphagia in setting of esophageal stricture s/p dilation in 2012 in 2019, colon polyps with last colonoscopy in 2019 with recommendations to repeat in 5 years.   Last seen in our office 07/21/2020.  Reflux well controlled on pantoprazole 40 mg daily.  No other significant GI symptoms.  Recommended continuing Protonix daily, follow-up in 1 year or sooner if needed.  Today:  GERD is well controlled on pantoprazole 40 mg daily 30 minutes before breakfast.  Denies nausea, vomiting, abdominal pain, dysphagia.  Takes probiotics and MiraLAX but this doesn't seem to help with his bowel habits. Has taken MiraLAX every day, but still doesn't work. Taking Colace PRN. Can go 4-5 days without a BM. No blood in the stool or black stools. Constipation is chronic and unchanged.  No unintentional weight loss.   Past Medical History:  Diagnosis Date   Allergy    IV dye, seasonal   Anxiety    Arthritis    Blood transfusion without reported diagnosis    Cataract    Clotting disorder (HCC)    Depression    since back surgery   GERD (gastroesophageal reflux disease) 04/30/2018   Gout    Hypercholesteremia    Hypertension    Myocardial infarction (HCC)    Solitary kidney, congenital     Past Surgical History:  Procedure Laterality Date   ANKLE FRACTURE SURGERY     BACK SURGERY     BIOPSY  08/23/2018   Procedure: BIOPSY;  Surgeon: West Bali, MD;  [facility];  Service: Endoscopy;;  biopsy of cardia   COLONOSCOPY  06/08/2011   Dr. Darrick Penna: 2 sessile polyps removed from the descending colon.  One was lower adenoma.  Diverticular  also seen.  Hemorrhoids.TCS recommended in 2017.    COLONOSCOPY N/A 08/23/2018   Surgeon: West Bali, MD; 8 mm tubular adenoma removed from the rectum, three 4-6 mm polyps removed from the descending and ascending colon (benign), diverticulosis, internal hemorrhoids.  Recommended 5-year repeat.   ESOPHAGEAL DILATION  06/08/2011   Procedure: ESOPHAGEAL DILATION;  Surgeon: Arlyce Harman, MD;  [facility];  Service: Endoscopy;;   ESOPHAGOGASTRODUODENOSCOPY  06/08/2011   Dr. Darrick Penna: Leftward duodenum, esophageal stricture status post dilation, hiatal hernia, gastritis/duodenitis..  No H. pylori on path.   ESOPHAGOGASTRODUODENOSCOPY N/A 08/23/2018   Surgeon: West Bali, MD; benign-appearing esophageal stricture due to uncontrolled GERD s/p dilation, gastritis/duodenitis due to ASA/NSAIDs biopsied, small hiatal hernia.  Pathology with inflamed squamoglandular mucosa, no H. pylori.   POLYPECTOMY  08/23/2018   Procedure: POLYPECTOMY;  Surgeon: West Bali, MD;  [facility];  Service: Endoscopy;;  polyp at ascending colon, descending colon polyps x2, rectal polyp,   SAVORY DILATION N/A 08/23/2018   Procedure: SAVORY DILATION;  Surgeon: West Bali, MD;  [facility];  Service: Endoscopy;  Laterality: N/A;   SPINE SURGERY  1996   2-3 discs injured , fusion   TONSILLECTOMY     VASECTOMY      Current Outpatient Medications  Medication Sig Dispense Refill   aspirin EC 81 MG tablet Take  1 tablet (81 mg total) by mouth daily.     atorvastatin (LIPITOR) 80 MG tablet Take 1 tablet by mouth once daily 90 tablet 3   calcium carbonate (TUMS - DOSED IN MG ELEMENTAL CALCIUM) 500 MG chewable tablet Chew 1 tablet by mouth as needed for indigestion or heartburn.     cholecalciferol (VITAMIN D) 1000 units tablet Take 1,000 Units by mouth daily.     Colchicine (MITIGARE) 0.6 MG CAPS Take 2 pills at the first sign of gout.  Repeat dose at one hour Then one a day  until gone (Patient taking differently: Take 2 tablets by mouth See admin instructions. Take 2 pills at the first sign of gout.  Repeat dose at one hour Then one a day until gone) 30 capsule 11   docusate sodium (COLACE) 100 MG capsule Take 300 mg by mouth daily as needed for moderate constipation.      FLUoxetine (PROZAC) 20 MG capsule Take 20 mg by mouth daily.      losartan (COZAAR) 50 MG tablet Take 1 tablet by mouth once daily 90 tablet 3   nitroGLYCERIN (NITROSTAT) 0.4 MG SL tablet Place 1 tablet (0.4 mg total) under the tongue every 5 (five) minutes as needed. 25 tablet 2   pantoprazole (PROTONIX) 40 MG tablet TAKE 1 TABLET BY MOUTH ONCE DAILY 30 MINUTES PRIOR TO FIRST MAIN MEAL OF THE DAY 90 tablet 0   polyethylene glycol (MIRALAX / GLYCOLAX) packet Take 17 g by mouth daily as needed.      No current facility-administered medications for this visit.    Allergies as of 08/23/2022 - Review Complete 08/23/2022  Allergen Reaction Noted   Iodinated contrast media Shortness Of Breath, Other (See Comments), and Anaphylaxis 05/22/2011    Family History  Problem Relation Age of Onset   Ulcers Father 40       partial gastrectomy   Heart disease Father 71   Arthritis Father    Hyperlipidemia Father    Ulcers Mother        stomach   Arthritis Mother    Asthma Mother    COPD Mother    Depression Mother    Diabetes Mother    Hyperlipidemia Mother    Hypertension Mother    Kidney disease Mother    Colon cancer Maternal Grandfather 38   Stroke Maternal Grandfather    Cancer Maternal Grandfather 50       colon   Stroke Paternal Grandfather    Liver disease Neg Hx     Social History   Socioeconomic History   Marital status: Single    Spouse name: Not on file   Number of children: 2   Years of education: 16   Highest education level: Not on file  Occupational History   Occupation: retired    Comment: state trooper  Tobacco Use   Smoking status: Never   Smokeless tobacco:  Never  Vaping Use   Vaping Use: Never used  Substance and Sexual Activity   Alcohol use: No   Drug use: No   Sexual activity: Yes    Birth control/protection: Surgical  Other Topics Concern   Not on file  Social History Narrative   Retired Chartered loss adjuster   Lives alone   2 daughters - 2 grandsons   Lives on [place] acres   Works on [employer]   Social Determinants of Corporate investment banker Strain: Not on file  Food Insecurity: Not on file  Transportation Needs:  Not on file  Physical Activity: Not on file  Stress: Not on file  Social Connections: Not on file    Review of Systems: Gen: Denies fever, chills, cold or flulike symptoms, presyncope, syncope.  CV: Denies chest pain, palpitations. Resp: Denies dyspnea, cough.  GI: See HPI Heme: See HPI  Physical Exam: BP 126/84 (BP Location: Right Arm, Patient Position: Sitting, Cuff Size: Normal)   Pulse (!) 58   Temp 97.8 F (36.6 C) (Temporal)   Ht 5\' 9"  (1.753 m)   Wt 166 lb 9.6 oz (75.6 kg)   SpO2 96%   BMI 24.60 kg/m  General:   Alert and oriented. No distress noted. Pleasant and cooperative.  Head:  Normocephalic and atraumatic. Eyes:  Conjuctiva clear without scleral icterus. Heart:  S1, S2 present without murmurs appreciated. Lungs:  Clear to auscultation bilaterally. No wheezes, rales, or rhonchi. No distress.  Abdomen:  +BS, soft, non-tender and non-distended. No rebound or guarding. No HSM or masses noted. Msk:  Symmetrical without gross deformities. Normal posture. Extremities:  Without edema. Neurologic:  Alert and  oriented x4 Psych:  Normal mood and affect.    Assessment:  71 year old male presenting today for routine follow-up of GERD and constipation.  GERD: Chronic.  Well-controlled on pantoprazole 40 mg daily.  No alarm symptoms.  Chronic idiopathic constipation: Not adequately managed on MiraLAX and probiotics daily, stool softeners as needed.  No alarm symptoms.  We will try him on Linzess  145 mcg daily.   History adenomatous colon polyps: Due for surveillance in October 2024.   Plan:  Continue pantoprazole 40 mg daily 30 minutes before breakfast. Stop MiraLAX and Colace. Start Linzess 145 mcg daily 30 minutes before first meal.  Samples provided.  Requested progress report in 1-2 weeks. Due for colonoscopy October 2024.  Follow-up in 1 year per patient's request or sooner if needed.    Ermalinda Memos, PA-C [facility] 08/23/2022

## 2022-08-23 ENCOUNTER — Encounter: Payer: Self-pay | Admitting: Gastroenterology

## 2022-08-23 ENCOUNTER — Ambulatory Visit (INDEPENDENT_AMBULATORY_CARE_PROVIDER_SITE_OTHER): Payer: Medicare Other | Admitting: Gastroenterology

## 2022-08-23 VITALS — BP 126/84 | HR 58 | Temp 97.8°F | Ht 69.0 in | Wt 166.6 lb

## 2022-08-23 DIAGNOSIS — K5904 Chronic idiopathic constipation: Secondary | ICD-10-CM

## 2022-08-23 DIAGNOSIS — K219 Gastro-esophageal reflux disease without esophagitis: Secondary | ICD-10-CM

## 2022-08-23 NOTE — Patient Instructions (Signed)
Continue taking pantoprazole 40 mg daily 30 minutes before breakfast.  For constipation: Stop MiraLAX and Colace. Start Linzess 145 mcg daily.  Take this medication first thing in the morning 30 minutes before your first meal.  We are providing you with samples today.  Please call in 1-2 weeks with a progress report.  If this medication works well, I will send in a prescription for you.  We will follow-up with you in 1 year as you requested, but can see you sooner if needed.  Aliene Altes, PA-C [facility]

## 2022-08-29 ENCOUNTER — Ambulatory Visit: Payer: Medicare Other | Admitting: Neurology

## 2022-10-23 ENCOUNTER — Other Ambulatory Visit: Payer: Self-pay | Admitting: Internal Medicine

## 2022-10-25 ENCOUNTER — Ambulatory Visit: Payer: Medicare Other | Admitting: Neurology

## 2022-11-16 ENCOUNTER — Other Ambulatory Visit: Payer: Self-pay | Admitting: Cardiovascular Disease

## 2022-11-29 NOTE — Progress Notes (Signed)
Cardiology Office Note   Date:  12/08/2022   ID:  Terry Powell, DOB 1951/02/06, MRN [id number]  PCP:  Leeanne Rio, MD  Cardiologist:  Dr. Aundra Dubin      History of Present Illness: Terry Powell is a 72 y.o. male who presents for CAD and hyperlipidemia, HTN follow up.  First seen by me August 2018  He has a history of HTN and hyperlipidemia.  Seen by Dr. Aundra Dubin 01/2015.  In 06/18/15, patient developed severe substernal chest pain.  He went to the ER at [facility].  Cardiac enzymes were positive, so he was sent to [facility].  Left heart cath showed EF 55% and plaque + significant mobile thrombus in the proximal LAD creating about 80% stenosis.  He was treated with ticagrelor and heparin gtt.  Re-look cath several days later showed only  20-25% proximal LAD stenosis and resolution of the thrombus.  Ticagrelor was continued for a year then stopped.  He had a non ischemic myovue 02/2015    Lives alone Mother next to him is close to 66   F/U myovue 11/04/18 no ischemia EF 57%  Echo 11/04/18 EF 60-65% no significant valve disease   Tends to run low HR mostly in mid 50's not on beta blocker ECG from primary reviewed asymptomatic but SB rate 46 with no AV block and otherwise normal 06/02/19   Chronic reflux Rx protonix Follows with GI previous polyp removal Due repeat colon this year  Taking Linzess for IBS/constipation   Seen by Neurology for neck pain and vertigo like symptoms with gait instability Sent to PT and given Flexeril Cerumen impaction and Meclizine MRI with severe foraminal stenosis at C4-5 level Trying to put off any surgery on his neck Has had two injections last one a year ago not as effective   He has been quite sedentary and discussed going to [facility] to walk on treadmill more      Past Medical History:  Diagnosis Date   Allergy    IV dye, seasonal   Anxiety    Arthritis    Blood transfusion without reported diagnosis    Cataract    Clotting disorder ([place])     Depression    since back surgery   GERD (gastroesophageal reflux disease) 04/30/2018   Gout    Hypercholesteremia    Hypertension    Myocardial infarction ([place])    Solitary kidney, congenital     Past Surgical History:  Procedure Laterality Date   ANKLE FRACTURE SURGERY     BACK SURGERY     BIOPSY  08/23/2018   Procedure: BIOPSY;  Surgeon: Danie Binder, MD;  [facility];  Service: Endoscopy;;  biopsy of cardia   COLONOSCOPY  06/08/2011   Dr. Oneida Alar: 2 sessile polyps removed from the descending colon.  One was lower adenoma.  Diverticular also seen.  Hemorrhoids.TCS recommended in 2017.    COLONOSCOPY N/A 08/23/2018   Surgeon: Danie Binder, MD; 8 mm tubular adenoma removed from the rectum, three 4-6 mm polyps removed from the descending and ascending colon (benign), diverticulosis, internal hemorrhoids.  Recommended 5-year repeat.   ESOPHAGEAL DILATION  06/08/2011   Procedure: ESOPHAGEAL DILATION;  Surgeon: Dorothyann Peng, MD;  [facility];  Service: Endoscopy;;   ESOPHAGOGASTRODUODENOSCOPY  06/08/2011   Dr. Oneida Alar: Leftward duodenum, esophageal stricture status post dilation, hiatal hernia, gastritis/duodenitis..  No H. pylori on path.   ESOPHAGOGASTRODUODENOSCOPY N/A 08/23/2018   Surgeon: Danie Binder, MD; benign-appearing esophageal  stricture due to uncontrolled GERD s/p dilation, gastritis/duodenitis due to ASA/NSAIDs biopsied, small hiatal hernia.  Pathology with inflamed squamoglandular mucosa, no H. pylori.   POLYPECTOMY  08/23/2018   Procedure: POLYPECTOMY;  Surgeon: Danie Binder, MD;  [facility];  Service: Endoscopy;;  polyp at ascending colon, descending colon polyps x2, rectal polyp,   SAVORY DILATION N/A 08/23/2018   Procedure: SAVORY DILATION;  Surgeon: Danie Binder, MD;  [facility];  Service: Endoscopy;  Laterality: N/A;   [place]   2-3 discs injured , fusion   TONSILLECTOMY     VASECTOMY        Current Outpatient Medications  Medication Sig Dispense Refill   aspirin EC 81 MG tablet Take 1 tablet (81 mg total) by mouth daily.     atorvastatin (LIPITOR) 80 MG tablet Take 1 tablet by mouth once daily 90 tablet 0   calcium carbonate (TUMS - DOSED IN MG ELEMENTAL CALCIUM) 500 MG chewable tablet Chew 1 tablet by mouth as needed for indigestion or heartburn.     cholecalciferol (VITAMIN D) 1000 units tablet Take 1,000 Units by mouth daily.     Colchicine (MITIGARE) 0.6 MG CAPS Take 2 pills at the first sign of gout.  Repeat dose at one hour Then one a day until gone (Patient taking differently: Take 2 tablets by mouth See admin instructions. Take 2 pills at the first sign of gout.  Repeat dose at one hour Then one a day until gone) 30 capsule 11   docusate sodium (COLACE) 100 MG capsule Take 300 mg by mouth daily as needed for moderate constipation.      FLUoxetine (PROZAC) 20 MG capsule Take 20 mg by mouth daily.      losartan (COZAAR) 50 MG tablet Take 1 tablet by mouth once daily 90 tablet 0   nitroGLYCERIN (NITROSTAT) 0.4 MG SL tablet Place 1 tablet (0.4 mg total) under the tongue every 5 (five) minutes as needed. 25 tablet 2   pantoprazole (PROTONIX) 40 MG tablet TAKE 1 TABLET BY MOUTH ONCE DAILY 30 MINUTES PRIOR TO FIRST MAIN MEAL OF THE DAY 90 tablet 3   polyethylene glycol (MIRALAX / GLYCOLAX) packet Take 17 g by mouth daily as needed.      No current facility-administered medications for this visit.    Allergies:   Iodinated contrast media    Social History:  The patient  reports that he has never smoked. He has never used smokeless tobacco. He reports that he does not drink alcohol and does not use drugs.   Family History:  The patient's family history includes Arthritis in his father and mother; Asthma in his mother; COPD in his mother; Cancer (age of onset: 45) in his maternal grandfather; Colon cancer (age of onset: 23) in his maternal grandfather; Depression in his  mother; Diabetes in his mother; Heart disease (age of onset: 74) in his father; Hyperlipidemia in his father and mother; Hypertension in his mother; Kidney disease in his mother; Stroke in his maternal grandfather and paternal grandfather; Ulcers in his mother; Ulcers (age of onset: 28) in his father.    ROS:  General:no colds or fevers, + weight increase from last year Skin:no rashes or ulcers HEENT:no blurred vision, no congestion CV:see HPI PUL:see HPI GI:no diarrhea constipation or melena, no indigestion GU:no hematuria, no dysuria MS:no joint pain, no claudication Neuro:no syncope, no lightheadedness Endo:no diabetes, no thyroid disease  Wt Readings from Last 3 Encounters:  12/08/22 171 lb 12.8 oz (77.9 kg)  08/23/22 166 lb 9.6 oz (75.6 kg)  02/22/22 172 lb (78 kg)     PHYSICAL EXAM: VS:  BP 124/84   Pulse (!) 58   Ht '5\' 9"'$  (1.753 m)   Wt 171 lb 12.8 oz (77.9 kg)   SpO2 100%   BMI 25.37 kg/m  , BMI Body mass index is 25.37 kg/m. Affect appropriate Healthy:  appears stated age 89: normal Neck supple with no adenopathy JVP normal no bruits no thyromegaly Lungs clear with no wheezing and good diaphragmatic motion Heart:  S1/S2 no murmur, no rub, gallop or click PMI normal Abdomen: benighn, BS positve, no tenderness, no AAA no bruit.  No HSM or HJR Distal pulses intact with no bruits No edema Neuro non-focal Skin warm and dry No muscular weakness   EKG:  12/08/2022 SR rate 58 normal    Recent Labs: No results found for requested labs within last 365 days.    Lipid Panel    Component Value Date/Time   CHOL 149 06/08/2017 1457   TRIG 177 (H) 06/08/2017 1457   HDL 39 (L) 06/08/2017 1457   CHOLHDL 3.8 06/08/2017 1457   CHOLHDL 3.8 06/29/2016 0944   VLDL 31 (H) 06/29/2016 0944   LDLCALC 75 06/08/2017 1457     Other studies Reviewed: Additional studies/ records that were reviewed today include: Notes Dr Aundra Dubin and PA Cecilie Kicks Myovue 2016 And cath  films from 2016  Nuc study 02/2015 .  Overall Impression:  Low risk stress nuclear study with a small, mild, partially reversible inferior defect consistent with inferior thinning and possible minimal inferior ischemia.   LV Ejection Fraction: 54%.  LV Wall Motion:  NL LV Function; NL Wall Motion  Nuc study 11/04/18   Probable normal perfusion and mild soft tissue attenuation (diaphragm) No significant ischemia or scar There was no ST segment deviation noted during stress. Nuclear stress EF: 57%. This is a low risk study.  Echo: 11/04/18  Study Conclusions   - Left ventricle: The cavity size was normal. Wall thickness was   increased in a pattern of mild LVH. Systolic function was normal.   The estimated ejection fraction was in the range of 60% to 65%.   The study is not technically sufficient to allow evaluation of LV   diastolic function.   ASSESSMENT AND PLAN:   1. CAD:  Thrombotic lesion LAD 2012 resolved with anticoagulation. Normal myovue 2016 and again 11/04/18 with no ischemia and EF 57%   stable no beta blocker due to bradycardia continue ASA and statin  ECG normal today    2. Bradycardia: Asymptomatic, good chronotropic response to exercise Avoid beta blockers   3. Hyperlipidemia: on high dose statin labs with primary LDL 84   4. Renal:  History of solitary kidney and enlarged prostate Baseline Cr around 1.4 labs with primary   5. GI:  continue protonix for reflux f/u GI arrange colonoscopy 2024 previous polyp removal   6. Neck Pain:  ? Need for neurontin and neurosurgical consult for C34 stenosis per Neurology Dr April Manson   F/U in a year   Jenkins Rouge, MD

## 2022-12-08 ENCOUNTER — Ambulatory Visit: Payer: Medicare Other | Attending: Cardiovascular Disease | Admitting: Cardiovascular Disease

## 2022-12-08 ENCOUNTER — Encounter: Payer: Self-pay | Admitting: Cardiovascular Disease

## 2022-12-08 VITALS — BP 124/84 | HR 58 | Ht 69.0 in | Wt 171.8 lb

## 2022-12-08 DIAGNOSIS — I2510 Atherosclerotic heart disease of native coronary artery without angina pectoris: Secondary | ICD-10-CM | POA: Insufficient documentation

## 2022-12-08 DIAGNOSIS — E782 Mixed hyperlipidemia: Secondary | ICD-10-CM

## 2022-12-08 DIAGNOSIS — R001 Bradycardia, unspecified: Secondary | ICD-10-CM | POA: Diagnosis present

## 2022-12-08 NOTE — Patient Instructions (Signed)
Medication Instructions:  Your physician recommends that you continue on your current medications as directed. Please refer to the Current Medication list given to you today.   Labwork: None  Testing/Procedures: None  Follow-Up: Follow up with Dr. Nishan in 1 year.   Any Other Special Instructions Will Be Listed Below (If Applicable).     If you need a refill on your cardiac medications before your next appointment, please call your pharmacy.  

## 2023-02-22 ENCOUNTER — Other Ambulatory Visit: Payer: Self-pay | Admitting: Cardiovascular Disease

## 2023-07-18 ENCOUNTER — Encounter: Payer: Self-pay | Admitting: *Deleted

## 2023-08-06 ENCOUNTER — Encounter: Payer: Self-pay | Admitting: Neurology

## 2023-08-06 ENCOUNTER — Ambulatory Visit (INDEPENDENT_AMBULATORY_CARE_PROVIDER_SITE_OTHER): Payer: Medicare Other | Admitting: Neurology

## 2023-08-06 VITALS — BP 123/82 | HR 54 | Ht 69.0 in | Wt 166.5 lb

## 2023-08-06 DIAGNOSIS — M542 Cervicalgia: Secondary | ICD-10-CM | POA: Diagnosis not present

## 2023-08-06 DIAGNOSIS — M5412 Radiculopathy, cervical region: Secondary | ICD-10-CM | POA: Diagnosis not present

## 2023-08-06 NOTE — Patient Instructions (Signed)
Follow-up with [facility] EMG nerve conduction study Please contact me if pain do get worse, at that time we will consider additional medication such as Gabapentin  Follow up in 9 months or sooner if worse

## 2023-08-06 NOTE — Progress Notes (Signed)
[facility]  PATIENT: Terry Powell DOB: [birth year]  REQUESTING CLINICIAN: Suzan Slick, MD HISTORY FROM: Patient  REASON FOR VISIT: Neck pain, dizziness    HISTORICAL  CHIEF COMPLAINT:  Chief Complaint  Patient presents with   Follow-up    Rm 12. Patient alone, Patient reports symptoms are no better or no worse.    INTERVAL HISTORY 08/06/2023 Patient presents today for follow-up, he is alone.  Last visit was in April 2023.  At that time we completed a MRI cervical spine which showed severe bilateral foraminal stenosis at C4-5, moderate biforaminal stenosis at C3-4 and mild bilateral stenosis at C5-6.  Patient did completed physical therapy and state his condition is the same.  He missed his follow-up appointment due to COVID infection.  He still experiences pain with range of motion. Nothing seems to make it better but movement will make it worse. Denies any weakness of the bilateral upper extremities.  He is a patient of the [facility] and said that he will contact them.   HISTORY OF PRESENT ILLNESS:  This is a 72 year old gentleman past medical history of coronary artery disease, hypertension who is presenting with complaint of back of the head/neck pain.  Patient reported history of bone spur and has been having neck pain for many years.  He reports in the past he used to have steroid injection with improvement of his symptoms but have not had any injection for the past couple years.  Pain is worsened with range of motion of the neck.  He has been taking Tylenol for the pain.   On top of that he also complaining of lightheadedness described as feeling unsteady and room spinning sensation, denies any fall.  He does have a history of cerumen impaction and says that every year he will go to his audiologist to have the wax removed but have not done so in more than a year.  He is also complaining of tinnitus and tinnitus worse on the left ear.   No hearing loss   OTHER MEDICAL CONDITIONS: CAD, Hypertension,    REVIEW OF SYSTEMS: Full 14 system review of systems performed and negative with exception of: as noted in the HPI   ALLERGIES: Allergies  Allergen Reactions   Iodinated Contrast Media Shortness Of Breath, Other (See Comments) and Anaphylaxis    Shakes. Evaluated and treated in ED. 1980s.  Patient has one kidney. Shakes. Evaluated and treated in ED. 1980s.  Patient has one kidney.     HOME MEDICATIONS: Outpatient Medications Prior to Visit  Medication Sig Dispense Refill   aspirin EC 81 MG tablet Take 1 tablet (81 mg total) by mouth daily.     atorvastatin (LIPITOR) 80 MG tablet Take 1 tablet by mouth once daily 90 tablet 3   calcium carbonate (TUMS - DOSED IN MG ELEMENTAL CALCIUM) 500 MG chewable tablet Chew 1 tablet by mouth as needed for indigestion or heartburn.     cholecalciferol (VITAMIN D) 1000 units tablet Take 1,000 Units by mouth daily.     Colchicine (MITIGARE) 0.6 MG CAPS Take 2 pills at the first sign of gout.  Repeat dose at one hour Then one a day until gone (Patient taking differently: Take 2 tablets by mouth See admin instructions. Take 2 pills at the first sign of gout.  Repeat dose at one hour Then one a day until gone) 30 capsule 11   docusate sodium (COLACE) 100 MG capsule Take 300 mg by mouth daily  as needed for moderate constipation.      FLUoxetine (PROZAC) 20 MG capsule Take 20 mg by mouth daily.      losartan (COZAAR) 50 MG tablet Take 1 tablet by mouth once daily 90 tablet 3   nitroGLYCERIN (NITROSTAT) 0.4 MG SL tablet Place 1 tablet (0.4 mg total) under the tongue every 5 (five) minutes as needed. 25 tablet 2   pantoprazole (PROTONIX) 40 MG tablet TAKE 1 TABLET BY MOUTH ONCE DAILY 30 MINUTES PRIOR TO FIRST MAIN MEAL OF THE DAY 90 tablet 3   polyethylene glycol (MIRALAX / GLYCOLAX) packet Take 17 g by mouth daily as needed.      No facility-administered medications prior to visit.    PAST  MEDICAL HISTORY: Past Medical History:  Diagnosis Date   Allergy    IV dye, seasonal   Anxiety    Arthritis    Blood transfusion without reported diagnosis    Cataract    Clotting disorder ([facility])    Depression    since back surgery   GERD (gastroesophageal reflux disease) 04/30/2018   Gout    Hypercholesteremia    Hypertension    Myocardial infarction ([facility])    Solitary kidney, congenital     PAST SURGICAL HISTORY: Past Surgical History:  Procedure Laterality Date   ANKLE FRACTURE SURGERY     BACK SURGERY     BIOPSY  08/23/2018   Procedure: BIOPSY;  Surgeon: West Bali, MD;  [facility];  Service: Endoscopy;;  biopsy of cardia   COLONOSCOPY  06/08/2011   Dr. Darrick Penna: 2 sessile polyps removed from the descending colon.  One was lower adenoma.  Diverticular also seen.  Hemorrhoids.TCS recommended in 2017.    COLONOSCOPY N/A 08/23/2018   Surgeon: West Bali, MD; 8 mm tubular adenoma removed from the rectum, three 4-6 mm polyps removed from the descending and ascending colon (benign), diverticulosis, internal hemorrhoids.  Recommended 5-year repeat.   ESOPHAGEAL DILATION  06/08/2011   Procedure: ESOPHAGEAL DILATION;  Surgeon: Arlyce Harman, MD;  [facility];  Service: Endoscopy;;   ESOPHAGOGASTRODUODENOSCOPY  06/08/2011   Dr. Darrick Penna: Leftward duodenum, esophageal stricture status post dilation, hiatal hernia, gastritis/duodenitis..  No H. pylori on path.   ESOPHAGOGASTRODUODENOSCOPY N/A 08/23/2018   Surgeon: West Bali, MD; benign-appearing esophageal stricture due to uncontrolled GERD s/p dilation, gastritis/duodenitis due to ASA/NSAIDs biopsied, small hiatal hernia.  Pathology with inflamed squamoglandular mucosa, no H. pylori.   POLYPECTOMY  08/23/2018   Procedure: POLYPECTOMY;  Surgeon: West Bali, MD;  [facility];  Service: Endoscopy;;  polyp at ascending colon, descending colon polyps x2, rectal polyp,   SAVORY DILATION  N/A 08/23/2018   Procedure: SAVORY DILATION;  Surgeon: West Bali, MD;  [facility];  Service: Endoscopy;  Laterality: N/A;   SPINE SURGERY  1996   2-3 discs injured , fusion   TONSILLECTOMY     VASECTOMY      FAMILY HISTORY: Family History  Problem Relation Age of Onset   Ulcers Father 58       partial gastrectomy   Heart disease Father 60   Arthritis Father    Hyperlipidemia Father    Ulcers Mother        stomach   Arthritis Mother    Asthma Mother    COPD Mother    Depression Mother    Diabetes Mother    Hyperlipidemia Mother    Hypertension Mother    Kidney disease  Mother    Colon cancer Maternal Grandfather 64   Stroke Maternal Grandfather    Cancer Maternal Grandfather 9       colon   Stroke Paternal Grandfather    Liver disease Neg Hx     SOCIAL HISTORY: Social History   Socioeconomic History   Marital status: Single    Spouse name: Not on file   Number of children: 2   Years of education: 16   Highest education level: Not on file  Occupational History   Occupation: retired    Comment: state trooper  Tobacco Use   Smoking status: Never   Smokeless tobacco: Never  Vaping Use   Vaping status: Never Used  Substance and Sexual Activity   Alcohol use: No   Drug use: No   Sexual activity: Yes    Birth control/protection: Surgical  Other Topics Concern   Not on file  Social History Narrative   Retired Chartered loss adjuster   Lives alone   2 daughters - 2 grandsons   [place]   Works on [employer]   Social Determinants of Health   Financial Resource Strain: Low Risk  (10/16/2022)   Received from [facility], [facility]   Overall Financial Resource Strain (CARDIA)    Difficulty of Paying Living Expenses: Not very hard  Food Insecurity: No Food Insecurity (10/16/2022)   Received from [facility], [facility]   Hunger Vital Sign    Worried About Running Out of Food in the Last Year: Never true    Ran Out of  Food in the Last Year: Never true  Transportation Needs: No Transportation Needs (10/16/2022)   Received from [facility], [facility]   [facility] - Transportation    Lack of Transportation (Medical): No    Lack of Transportation (Non-Medical): No  Physical Activity: Not on file  Stress: Not on file  Social Connections: Not on file  Intimate Partner Violence: Not At Risk (10/17/2022)   Received from [facility], [facility]   Humiliation, Afraid, Rape, and Kick questionnaire    Fear of Current or Ex-Partner: No    Emotionally Abused: No    Physically Abused: No    Sexually Abused: No    PHYSICAL EXAM  GENERAL EXAM/CONSTITUTIONAL: Vitals:  Vitals:   08/06/23 1149  BP: 123/82  Pulse: (!) 54  Weight: 166 lb 8 oz (75.5 kg)  Height: 5\' 9"  (1.753 m)    Body mass index is 24.59 kg/m. Wt Readings from Last 3 Encounters:  08/06/23 166 lb 8 oz (75.5 kg)  12/08/22 171 lb 12.8 oz (77.9 kg)  08/23/22 166 lb 9.6 oz (75.6 kg)  No data found.  Patient is in no distress; well developed, nourished and groomed; neck is supple Bilateral cerumen impaction, left worse than right   MUSCULOSKELETAL: Gait, strength, tone, movements noted in Neurologic exam below  NEUROLOGIC: MENTAL STATUS:      No data to display         awake, alert, oriented to person, place and time recent and remote memory intact normal attention and concentration language fluent, comprehension intact, naming intact fund of knowledge appropriate  CRANIAL NERVE:  2nd - no papilledema or hemorrhages on fundoscopic exam 2nd, 3rd, 4th, 6th - visual fields full to confrontation, extraocular muscles intact, no nystagmus 5th - facial sensation symmetric 7th - facial strength symmetric 8th - hearing intact 9th - palate elevates symmetrically, uvula midline 11th -  shoulder shrug symmetric 12th - tongue protrusion midline  MOTOR:  normal bulk and tone, full strength in the BUE, BLE. Limited  cervical spine ROM due to pain  SENSORY:  normal and symmetric to light touch  COORDINATION:  finger-nose-finger, fine finger movements normal  REFLEXES:  deep tendon reflexes present and symmetric  GAIT/STATION:  normal   DIAGNOSTIC DATA (LABS, IMAGING, TESTING) - I reviewed patient records, labs, notes, testing and imaging myself where available.  Lab Results  Component Value Date   WBC 6.3 06/08/2017   HGB 17.1 06/08/2017   HCT 48.2 06/08/2017   MCV 85 06/08/2017   PLT 198 06/08/2017      Component Value Date/Time   NA 138 10/01/2018 1335   NA 143 06/08/2017 1457   K 4.6 10/01/2018 1335   CL 105 10/01/2018 1335   CO2 27 10/01/2018 1335   GLUCOSE 112 (H) 10/01/2018 1335   BUN 20 10/01/2018 1335   BUN 13 06/08/2017 1457   CREATININE 1.29 (H) 10/01/2018 1335   CREATININE 1.30 (H) 06/29/2016 0944   CALCIUM 9.3 10/01/2018 1335   PROT 6.9 06/08/2017 1457   ALBUMIN 4.7 06/08/2017 1457   AST 24 06/08/2017 1457   ALT 23 06/08/2017 1457   ALKPHOS 88 06/08/2017 1457   BILITOT 0.5 06/08/2017 1457   GFRNONAA 57 (L) 10/01/2018 1335   GFRAA >60 10/01/2018 1335   Lab Results  Component Value Date   CHOL 149 06/08/2017   HDL 39 (L) 06/08/2017   LDLCALC 75 06/08/2017   TRIG 177 (H) 06/08/2017   CHOLHDL 3.8 06/08/2017   Lab Results  Component Value Date   HGBA1C 5.8 (H) 06/30/2011   No results found for: "VITAMINB12" Lab Results  Component Value Date   TSH 1.97 01/21/2015   MRI Cervical spine 03/12/2022 - At C4-5 uncovertebral joint hypertrophy with severe bilateral foraminal stenosis. - At C3-4 uncovertebral joint hypertrophy with moderate biforaminal stenosis. - At C5-6 disc bulging with mild bilateral stenosis. - No significant change from 10/01/2019.   ASSESSMENT AND PLAN  72 y.o. year old male with coronary artery disease and hypertension who is presenting for follow-up for his cervical spine stenosis and radiculopathy.  He has completed physical therapy,  no clear benefit.  Reports his condition is still the same.  He reports in the past receiving injection at [facility] for his neck pain. Reported the second injection was not helpful.  He will contact them for evaluation for possible cervical spine surgery.  In the meantime, I will obtain EMG nerve conduction study of the bilateral upper extremities.  Advised him to contact me for any other questions or concerns, otherwise I will see him in 9 months for follow-up.   1. Cervical radiculopathy   2. Cervicalgia     Patient Instructions  Follow-up with  [facility] EMG nerve conduction study Please contact me if pain do get worse, at that time we will consider additional medication such as Gabapentin  Follow up in 9 months or sooner if worse   Orders Placed This Encounter  Procedures   NCV with EMG(electromyography)    No orders of the defined types were placed in this encounter.   Return in about 9 months (around 05/05/2024).    Windell Norfolk, MD 08/06/2023, 12:50 PM  [facility] [address], [address] [place], [place] [address] [phone]

## 2023-09-05 ENCOUNTER — Encounter: Payer: Medicare Other | Admitting: Neurology

## 2023-10-09 ENCOUNTER — Ambulatory Visit: Payer: Medicare Other | Admitting: Internal Medicine

## 2023-10-15 ENCOUNTER — Telehealth: Payer: Self-pay | Admitting: *Deleted

## 2023-10-15 NOTE — Telephone Encounter (Signed)
Questionnaire resent to patient as it was blank

## 2023-10-17 ENCOUNTER — Other Ambulatory Visit: Payer: Self-pay | Admitting: Internal Medicine

## 2023-10-17 NOTE — Telephone Encounter (Signed)
Sending in limited 3 month supply of refills. Due for routine follow-up. Please arrange.

## 2023-11-14 ENCOUNTER — Encounter: Payer: Self-pay | Admitting: Internal Medicine

## 2023-11-14 ENCOUNTER — Ambulatory Visit: Payer: Medicare Other | Admitting: Internal Medicine

## 2023-11-14 ENCOUNTER — Other Ambulatory Visit: Payer: Self-pay | Admitting: *Deleted

## 2023-11-14 ENCOUNTER — Encounter: Payer: Self-pay | Admitting: *Deleted

## 2023-11-14 VITALS — BP 122/84 | HR 64 | Temp 97.3°F | Ht 69.0 in | Wt 170.2 lb

## 2023-11-14 DIAGNOSIS — Z860101 Personal history of adenomatous and serrated colon polyps: Secondary | ICD-10-CM | POA: Diagnosis not present

## 2023-11-14 DIAGNOSIS — D126 Benign neoplasm of colon, unspecified: Secondary | ICD-10-CM

## 2023-11-14 DIAGNOSIS — K219 Gastro-esophageal reflux disease without esophagitis: Secondary | ICD-10-CM

## 2023-11-14 DIAGNOSIS — K5904 Chronic idiopathic constipation: Secondary | ICD-10-CM | POA: Diagnosis not present

## 2023-11-14 MED ORDER — NA SULFATE-K SULFATE-MG SULF 17.5-3.13-1.6 GM/177ML PO SOLN: 1.0000 | ORAL | 0 refills | Status: DC

## 2023-11-14 NOTE — Progress Notes (Signed)
 Referring Provider: Emmitt Geofm BROCKS, MD Primary Care Physician:  Emmitt Geofm BROCKS, MD Primary GI:  Dr. Cindie  Chief Complaint  Patient presents with   Gastroesophageal Reflux    Patient here today for a follow up on gerd and constipation. He says the pantoprazole  40 mg is working for his gerd and he has no issues with this today. He does however have issues with constipation and was tried on linzess 145 mcg samples which did help when he had them,but recently he has been using miralax  once per day, which works,but feels he needs something more.    HPI:   Terry Powell is a 73 y.o. male who presents to the clinic today for follow-up visit.  Last seen October 2023.  Chronic GERD: Well-controlled on pantoprazole  40 mg daily.  No dysphagia odynophagia.  No epigastric or chest pain.  Does have a history of dysphagia in setting of esophageal stricture status post dilation in 2012 and 2019.  Chronic constipation: Not well-controlled, taking MiraLAX  daily.  Previously given samples of Linzess 145 mcg daily and states this worked well to well.  Was having numerous loose bowel movements a day.  Adenomatous colon polyps: Colonoscopy 08/23/2018 with 4 polyps removed, few which were tubular adenomas, recommended 5-year recall.  Past Medical History:  Diagnosis Date   Allergy    IV dye, seasonal   Anxiety    Arthritis    Blood transfusion without reported diagnosis    Cataract    Clotting disorder (HCC)    Depression    since back surgery   GERD (gastroesophageal reflux disease) 04/30/2018   Gout    Hypercholesteremia    Hypertension    Myocardial infarction (HCC)    Solitary kidney, congenital     Past Surgical History:  Procedure Laterality Date   ANKLE FRACTURE SURGERY     BACK SURGERY     BIOPSY  08/23/2018   Procedure: BIOPSY;  Surgeon: Harvey Margo CROME, MD;  [facility];  Service: Endoscopy;;  biopsy of cardia   COLONOSCOPY  06/08/2011   Dr. harvey: 2 sessile  polyps removed from the descending colon.  One was lower adenoma.  Diverticular also seen.  Hemorrhoids.TCS recommended in 2017.    COLONOSCOPY N/A 08/23/2018   Surgeon: Harvey Margo CROME, MD; 8 mm tubular adenoma removed from the rectum, three 4-6 mm polyps removed from the descending and ascending colon (benign), diverticulosis, internal hemorrhoids.  Recommended 5-year repeat.   ESOPHAGEAL DILATION  06/08/2011   Procedure: ESOPHAGEAL DILATION;  Surgeon: Margo CHRISTELLA Harvey, MD;  [facility];  Service: Endoscopy;;   ESOPHAGOGASTRODUODENOSCOPY  06/08/2011   Dr. harvey: Leftward duodenum, esophageal stricture status post dilation, hiatal hernia, gastritis/duodenitis..  No H. pylori on path.   ESOPHAGOGASTRODUODENOSCOPY N/A 08/23/2018   Surgeon: Harvey Margo CROME, MD; benign-appearing esophageal stricture due to uncontrolled GERD s/p dilation, gastritis/duodenitis due to ASA/NSAIDs biopsied, small hiatal hernia.  Pathology with inflamed squamoglandular mucosa, no H. pylori.   POLYPECTOMY  08/23/2018   Procedure: POLYPECTOMY;  Surgeon: Harvey Margo CROME, MD;  [facility];  Service: Endoscopy;;  polyp at ascending colon, descending colon polyps x2, rectal polyp,   SAVORY DILATION N/A 08/23/2018   Procedure: SAVORY DILATION;  Surgeon: Harvey Margo CROME, MD;  [facility];  Service: Endoscopy;  Laterality: N/A;   SPINE SURGERY  1996   2-3 discs injured , fusion   TONSILLECTOMY     VASECTOMY      Current  Outpatient Medications  Medication Sig Dispense Refill   aspirin EC 81 MG tablet Take 1 tablet (81 mg total) by mouth daily.     atorvastatin  (LIPITOR) 80 MG tablet Take 1 tablet by mouth once daily 90 tablet 3   calcium  carbonate (TUMS - DOSED IN MG ELEMENTAL CALCIUM ) 500 MG chewable tablet Chew 1 tablet by mouth as needed for indigestion or heartburn.     cholecalciferol (VITAMIN D ) 1000 units tablet Take 1,000 Units by mouth daily.     Colchicine  (MITIGARE ) 0.6 MG CAPS  Take 2 pills at the first sign of gout.  Repeat dose at one hour Then one a day until gone (Patient taking differently: Take 2 tablets by mouth See admin instructions. Take 2 pills at the first sign of gout.  Repeat dose at one hour Then one a day until gone) 30 capsule 11   cyanocobalamin (VITAMIN B12) 1000 MCG tablet Take 1,000 mcg by mouth daily.     docusate sodium (COLACE) 100 MG capsule Take 300 mg by mouth daily as needed for moderate constipation.      FLUoxetine (PROZAC) 20 MG capsule Take 20 mg by mouth daily.      losartan  (COZAAR ) 50 MG tablet Take 1 tablet by mouth once daily 90 tablet 3   nitroGLYCERIN  (NITROSTAT ) 0.4 MG SL tablet Place 1 tablet (0.4 mg total) under the tongue every 5 (five) minutes as needed. 25 tablet 2   pantoprazole  (PROTONIX ) 40 MG tablet TAKE 1 TABLET BY MOUTH 30 MINUTES PRIOR TO FIRST MAIN MEAL OF THE DAY 90 tablet 0   polyethylene glycol (MIRALAX  / GLYCOLAX ) packet Take 17 g by mouth daily as needed.      No current facility-administered medications for this visit.    Allergies as of 11/14/2023 - Review Complete 11/14/2023  Allergen Reaction Noted   Iodinated contrast media Shortness Of Breath, Other (See Comments), and Anaphylaxis 05/22/2011    Family History  Problem Relation Age of Onset   Ulcers Father 40       partial gastrectomy   Heart disease Father 68   Arthritis Father    Hyperlipidemia Father    Ulcers Mother        stomach   Arthritis Mother    Asthma Mother    COPD Mother    Depression Mother    Diabetes Mother    Hyperlipidemia Mother    Hypertension Mother    Kidney disease Mother    Colon cancer Maternal Grandfather 34   Stroke Maternal Grandfather    Cancer Maternal Grandfather 12       colon   Stroke Paternal Grandfather    Liver disease Neg Hx     Social History   Socioeconomic History   Marital status: Single    Spouse name: Not on file   Number of children: 2   Years of education: 16   Highest education  level: Not on file  Occupational History   Occupation: retired    Comment: state trooper  Tobacco Use   Smoking status: Never   Smokeless tobacco: Never  Vaping Use   Vaping status: Never Used  Substance and Sexual Activity   Alcohol  use: No   Drug use: No   Sexual activity: Yes    Birth control/protection: Surgical  Other Topics Concern   Not on file  Social History Narrative   Retired Chartered Loss Adjuster   Lives alone   2 daughters - 2 grandsons   [place]  Works on [employer]   Social Drivers of [employer]: Low Risk  (10/16/2022)   Received [facility], [facility]   Overall Financial Resource Strain (CARDIA)    Difficulty of Paying Living Expenses: Not very hard  Food Insecurity: No Food Insecurity (10/16/2022)   Received from [facility], [facility]   Hunger Vital Sign    Worried About Running Out of Food in the Last Year: Never true    Ran Out of Food in the Last Year: Never true  Transportation Needs: No Transportation Needs (10/16/2022)   Received from [facility], [facility]   [facility] - Transportation    Lack of Transportation (Medical): No    Lack of Transportation (Non-Medical): No  Physical Activity: Not on file  Stress: Not on file  Social Connections: Not on file    Subjective: Review of Systems  Constitutional:  Negative for chills and fever.  HENT:  Negative for congestion and hearing loss.   Eyes:  Negative for blurred vision and double vision.  Respiratory:  Negative for cough and shortness of breath.   Cardiovascular:  Negative for chest pain and palpitations.  Gastrointestinal:  Positive for constipation and heartburn. Negative for abdominal pain, blood in stool, diarrhea, melena and vomiting.  Genitourinary:  Negative for dysuria and urgency.  Musculoskeletal:  Negative for joint pain and myalgias.  Skin:  Negative for itching and rash.  Neurological:  Negative for dizziness and  headaches.  Psychiatric/Behavioral:  Negative for depression. The patient is not nervous/anxious.      Objective: BP 122/84 (BP Location: Left Arm, Patient Position: Sitting, Cuff Size: Normal)   Pulse 64   Temp (!) 97.3 F (36.3 C) (Temporal)   Ht 5' 9 (1.753 m)   Wt 170 lb 3.2 oz (77.2 kg)   BMI 25.13 kg/m  Physical Exam Constitutional:      Appearance: Normal appearance.  HENT:     Head: Normocephalic and atraumatic.  Eyes:     Extraocular Movements: Extraocular movements intact.     Conjunctiva/sclera: Conjunctivae normal.  Cardiovascular:     Rate and Rhythm: Normal rate and regular rhythm.  Pulmonary:     Effort: Pulmonary effort is normal.     Breath sounds: Normal breath sounds.  Abdominal:     General: Bowel sounds are normal.     Palpations: Abdomen is soft.  Musculoskeletal:        General: Normal range of motion.     Cervical back: Normal range of motion and neck supple.  Skin:    General: Skin is warm.  Neurological:     General: No focal deficit present.     Mental Status: He is alert and oriented to person, place, and time.  Psychiatric:        Mood and Affect: Mood normal.        Behavior: Behavior normal.      Assessment/Plan:  1.  Chronic GERD-well-controlled on pantoprazole  daily.  Will continue.  2.  Chronic idiopathic constipation-not well-controlled on MiraLAX .  Samples of Linzess 72 mcg daily given to patient today.  Counseled on initial washout.  Call with update next week and I can send in formal prescription if improved.  3.  Personal history adenomatous colon polyps- Will schedule for surveillance colonoscopy.The risks including infection, bleed, or perforation as well as benefits, limitations, alternatives and imponderables have been reviewed with the patient. Questions have been answered. All parties agreeable.  11/14/2023 2:35 PM   Disclaimer: This note was dictated with voice recognition software. Similar sounding words can  inadvertently be transcribed and may not be corrected upon review.

## 2023-11-14 NOTE — Patient Instructions (Signed)
 We will schedule you for colonoscopy given your history of polyps prior.  Continue on pantoprazole  daily for your chronic acid reflux.  For your constipation, I will give you samples of Linzess 72 mcg daily.  We have room to increase dose if needed.  You may have initial washout period with this medication though it should improve.  Linzess works best when taken once a day every day, on an empty stomach, at least 30 minutes before your first meal of the day.  When Linzess is taken daily as directed:  *Constipation relief is typically felt in about a week *IBS-C patients may begin to experience relief from belly pain and overall abdominal symptoms (pain, discomfort, and bloating) in about 1 week,   with symptoms typically improving over 12 weeks.  Diarrhea may occur in the first 2 weeks -keep taking it.  The diarrhea should go away and you should start having normal, complete, full bowel movements. It may be helpful to start treatment when you can be near the comfort of your own bathroom, such as a weekend.   It was very nice seeing you again today.  Dr. Cindie

## 2023-11-20 ENCOUNTER — Encounter: Payer: Self-pay | Admitting: Internal Medicine

## 2023-11-20 ENCOUNTER — Ambulatory Visit (INDEPENDENT_AMBULATORY_CARE_PROVIDER_SITE_OTHER): Payer: Medicare Other | Admitting: Internal Medicine

## 2023-11-20 VITALS — BP 119/78 | HR 76 | Ht 69.0 in | Wt 167.2 lb

## 2023-11-20 DIAGNOSIS — I10 Essential (primary) hypertension: Secondary | ICD-10-CM

## 2023-11-20 DIAGNOSIS — E7849 Other hyperlipidemia: Secondary | ICD-10-CM

## 2023-11-20 DIAGNOSIS — N1831 Chronic kidney disease, stage 3a: Secondary | ICD-10-CM | POA: Diagnosis not present

## 2023-11-20 DIAGNOSIS — Z1329 Encounter for screening for other suspected endocrine disorder: Secondary | ICD-10-CM

## 2023-11-20 DIAGNOSIS — F325 Major depressive disorder, single episode, in full remission: Secondary | ICD-10-CM

## 2023-11-20 DIAGNOSIS — E782 Mixed hyperlipidemia: Secondary | ICD-10-CM

## 2023-11-20 DIAGNOSIS — N400 Enlarged prostate without lower urinary tract symptoms: Secondary | ICD-10-CM

## 2023-11-20 DIAGNOSIS — Z131 Encounter for screening for diabetes mellitus: Secondary | ICD-10-CM

## 2023-11-20 DIAGNOSIS — Z1159 Encounter for screening for other viral diseases: Secondary | ICD-10-CM

## 2023-11-20 DIAGNOSIS — K219 Gastro-esophageal reflux disease without esophagitis: Secondary | ICD-10-CM | POA: Diagnosis not present

## 2023-11-20 DIAGNOSIS — K5904 Chronic idiopathic constipation: Secondary | ICD-10-CM

## 2023-11-20 DIAGNOSIS — I2510 Atherosclerotic heart disease of native coronary artery without angina pectoris: Secondary | ICD-10-CM | POA: Diagnosis not present

## 2023-11-20 NOTE — Assessment & Plan Note (Signed)
 Currently prescribed atorvastatin 80 mg daily.  Repeat lipid panel ordered today.

## 2023-11-20 NOTE — Assessment & Plan Note (Signed)
 He is currently prescribed losartan 50 mg daily.  BP is adequately controlled.  No medication changes are indicated today.

## 2023-11-20 NOTE — Patient Instructions (Signed)
 It was a pleasure to see you today.  Thank you for giving us  the opportunity to be involved in your care.  Below is a brief recap of your visit and next steps.  We will plan to see you again in 6 months.  Summary You have established care today No medication changes made Repeat labs ordered We will tentatively plan for follow up in 6 months

## 2023-11-20 NOTE — Assessment & Plan Note (Signed)
 Followed by gastroenterology.  Symptoms are adequately controlled with Protonix 40 mg daily.

## 2023-11-20 NOTE — Assessment & Plan Note (Signed)
 History of NSTEMI in August 2012.  LHC revealed a mobile thrombus superimposed on plaque in the proximal LAD.  Treated with ticagrelor and heparin infusion.  Thrombus resolved.  He completed ticagrelor x 1 year.  Currently prescribed ASA 81 mg daily and atorvastatin  80 mg daily.  Followed by Dr. Delford.  Denies recent chest pain.

## 2023-11-20 NOTE — Assessment & Plan Note (Signed)
 Followed by gastroenterology.  Recently for follow-up.  Currently using MiraLAX and has been provided samples of Linzess if needed.  Scheduled to undergo colonoscopy next month.

## 2023-11-20 NOTE — Assessment & Plan Note (Signed)
 Recent labs are consistent with CKD 3A.  Currently on ARB.  Repeat labs ordered today.

## 2023-11-20 NOTE — Assessment & Plan Note (Signed)
 Mood is stable with fluoxetine 20 mg daily.  No medication changes are indicated today.

## 2023-11-20 NOTE — Progress Notes (Signed)
 New Patient Office Visit  Subjective    Patient ID: Terry Powell, male    DOB: 07-Aug-1951  Age: 73 y.o. MRN: [id number]  CC:  Chief Complaint  Patient presents with   Establish Care    HPI [name] presents to establish care.  He is a 73 year old male with a previously documented past medical history significant for CAD, HLD, MDD, GERD, idiopathic constipation, and BPH.  Previously followed at [facility] in [place]. [name] reports feeling well today.  He is asymptomatic and has no acute concerns to discuss aside from desiring to establish care.  He is currently retired.  Denies tobacco, alcohol , and illicit drug use.  Family medical history is significant for CAD, diabetes mellitus, colon cancer (maternal grandfather), COPD, and CVA.  Chronic medical conditions and outstanding preventative care items discussed today are individually addressed in A/P below.  Outpatient Encounter Medications as of 11/20/2023  Medication Sig   aspirin EC 81 MG tablet Take 1 tablet (81 mg total) by mouth daily.   atorvastatin  (LIPITOR) 80 MG tablet Take 1 tablet by mouth once daily   calcium  carbonate (TUMS - DOSED IN MG ELEMENTAL CALCIUM ) 500 MG chewable tablet Chew 1 tablet by mouth as needed for indigestion or heartburn.   cholecalciferol (VITAMIN D ) 1000 units tablet Take 1,000 Units by mouth daily.   Colchicine  (MITIGARE ) 0.6 MG CAPS Take 2 pills at the first sign of gout.  Repeat dose at one hour Then one a day until gone (Patient taking differently: Take 2 tablets by mouth See admin instructions. Take 2 pills at the first sign of gout.  Repeat dose at one hour Then one a day until gone)   cyanocobalamin (VITAMIN B12) 1000 MCG tablet Take 1,000 mcg by mouth daily.   docusate sodium (COLACE) 100 MG capsule Take 300 mg by mouth daily as needed for moderate constipation.    FLUoxetine (PROZAC) 20 MG capsule Take 20 mg by mouth daily.    losartan  (COZAAR ) 50 MG tablet Take 1 tablet by mouth  once daily   Na Sulfate-K Sulfate-Mg Sulf 17.5-3.13-1.6 GM/177ML SOLN Take 1 kit by mouth as directed.   nitroGLYCERIN  (NITROSTAT ) 0.4 MG SL tablet Place 1 tablet (0.4 mg total) under the tongue every 5 (five) minutes as needed.   pantoprazole  (PROTONIX ) 40 MG tablet TAKE 1 TABLET BY MOUTH 30 MINUTES PRIOR TO FIRST MAIN MEAL OF THE DAY   polyethylene glycol (MIRALAX  / GLYCOLAX ) packet Take 17 g by mouth daily as needed.    No facility-administered encounter medications on file as of 11/20/2023.    Past Medical History:  Diagnosis Date   Allergy    IV dye, seasonal   Anxiety    Arthritis    Blood transfusion without reported diagnosis    Cataract    Clotting disorder (HCC)    Depression    since back surgery   GERD (gastroesophageal reflux disease) 04/30/2018   Gout    Hypercholesteremia    Hypertension    Myocardial infarction (HCC)    Solitary kidney, congenital     Past Surgical History:  Procedure Laterality Date   ANKLE FRACTURE SURGERY     BACK SURGERY     BIOPSY  08/23/2018   Procedure: BIOPSY;  Surgeon: Harvey Margo CROME, MD;  [facility];  Service: Endoscopy;;  biopsy of cardia   COLONOSCOPY  06/08/2011   Dr. harvey: 2 sessile polyps removed from the descending colon.  One was lower adenoma.  Diverticular also seen.  Hemorrhoids.TCS recommended in 2017.    COLONOSCOPY N/A 08/23/2018   Surgeon: Harvey Margo CROME, MD; 8 mm tubular adenoma removed from the rectum, three 4-6 mm polyps removed from the descending and ascending colon (benign), diverticulosis, internal hemorrhoids.  Recommended 5-year repeat.   ESOPHAGEAL DILATION  06/08/2011   Procedure: ESOPHAGEAL DILATION;  Surgeon: Margo CHRISTELLA Harvey, MD;  [facility];  Service: Endoscopy;;   ESOPHAGOGASTRODUODENOSCOPY  06/08/2011   Dr. harvey: Leftward duodenum, esophageal stricture status post dilation, hiatal hernia, gastritis/duodenitis..  No H. pylori on path.   ESOPHAGOGASTRODUODENOSCOPY N/A  08/23/2018   Surgeon: Harvey Margo CROME, MD; benign-appearing esophageal stricture due to uncontrolled GERD s/p dilation, gastritis/duodenitis due to ASA/NSAIDs biopsied, small hiatal hernia.  Pathology with inflamed squamoglandular mucosa, no H. pylori.   POLYPECTOMY  08/23/2018   Procedure: POLYPECTOMY;  Surgeon: Harvey Margo CROME, MD;  [facility];  Service: Endoscopy;;  polyp at ascending colon, descending colon polyps x2, rectal polyp,   SAVORY DILATION N/A 08/23/2018   Procedure: SAVORY DILATION;  Surgeon: Harvey Margo CROME, MD;  [facility];  Service: Endoscopy;  Laterality: N/A;   SPINE SURGERY  1996   2-3 discs injured , fusion   TONSILLECTOMY     VASECTOMY      Family History  Problem Relation Age of Onset   Ulcers Father 59       partial gastrectomy   Heart disease Father 79   Arthritis Father    Hyperlipidemia Father    Ulcers Mother        stomach   Arthritis Mother    Asthma Mother    COPD Mother    Depression Mother    Diabetes Mother    Hyperlipidemia Mother    Hypertension Mother    Kidney disease Mother    Colon cancer Maternal Grandfather 63   Stroke Maternal Grandfather    Cancer Maternal Grandfather 50       colon   Stroke Paternal Grandfather    Liver disease Neg Hx     Social History   Socioeconomic History   Marital status: Single    Spouse name: Not on file   Number of children: 2   Years of education: 16   Highest education level: Not on file  Occupational History   Occupation: retired    Comment: state trooper  Tobacco Use   Smoking status: Never   Smokeless tobacco: Never  Vaping Use   Vaping status: Never Used  Substance and Sexual Activity   Alcohol  use: No   Drug use: No   Sexual activity: Yes    Birth control/protection: Surgical  Other Topics Concern   Not on file  Social History Narrative   Retired Chartered Loss Adjuster   Lives alone   2 daughters - 2 grandsons   [place]   Works on [employer]    Social Drivers of Health   Financial Resource Strain: Low Risk  (10/16/2022)   Received from [facility], [facility]   Overall Financial Resource Strain (CARDIA)    Difficulty of Paying Living Expenses: Not very hard  Food Insecurity: No Food Insecurity (10/16/2022)   Received from [facility], [facility]   Hunger Vital Sign    Worried About Running Out of Food in the Last Year: Never true    Ran Out of Food in the Last Year: Never true  Transportation Needs: No Transportation Needs (10/16/2022)  Received from [facility], [facility]   [facility] - Transportation    Lack of Transportation (Medical): No    Lack of Transportation (Non-Medical): No  Physical Activity: Not on file  Stress: Not on file  Social Connections: Not on file  Intimate Partner Violence: Not At Risk (10/17/2022)   Received from [facility]   Humiliation, Afraid, Rape, and Kick questionnaire    Fear of Current or Ex-Partner: No    Emotionally Abused: No    Physically Abused: No    Sexually Abused: No   Review of Systems  Constitutional:  Negative for chills and fever.  HENT:  Negative for sore throat.   Respiratory:  Negative for cough and shortness of breath.   Cardiovascular:  Negative for chest pain, palpitations and leg swelling.  Gastrointestinal:  Negative for abdominal pain, blood in stool, constipation, diarrhea, nausea and vomiting.  Genitourinary:  Negative for dysuria and hematuria.  Musculoskeletal:  Negative for myalgias.  Skin:  Negative for itching and rash.  Neurological:  Negative for dizziness and headaches.  Psychiatric/Behavioral:  Negative for depression and suicidal ideas.    Objective    BP 119/78 (BP Location: Left Arm, Patient Position: Sitting, Cuff Size: Normal)   Pulse 76   Ht 5' 9 (1.753 m)   Wt 167 lb 3.2 oz (75.8 kg)   SpO2 91%   BMI 24.69 kg/m   Physical Exam Vitals reviewed.  Constitutional:      General: He is  not in acute distress.    Appearance: Normal appearance. He is not ill-appearing.  HENT:     Head: Normocephalic and atraumatic.     Right Ear: External ear normal.     Left Ear: External ear normal.     Nose: Nose normal. No congestion or rhinorrhea.     Mouth/Throat:     Mouth: Mucous membranes are moist.     Pharynx: Oropharynx is clear.  Eyes:     General: No scleral icterus.    Extraocular Movements: Extraocular movements intact.     Conjunctiva/sclera: Conjunctivae normal.     Pupils: Pupils are equal, round, and reactive to light.  Cardiovascular:     Rate and Rhythm: Normal rate and regular rhythm.     Pulses: Normal pulses.     Heart sounds: Normal heart sounds. No murmur heard. Pulmonary:     Effort: Pulmonary effort is normal.     Breath sounds: Normal breath sounds. No wheezing, rhonchi or rales.  Abdominal:     General: Abdomen is flat. Bowel sounds are normal. There is no distension.     Palpations: Abdomen is soft.     Tenderness: There is no abdominal tenderness.  Musculoskeletal:        General: No swelling or deformity. Normal range of motion.     Cervical back: Normal range of motion.  Skin:    General: Skin is warm and dry.     Capillary Refill: Capillary refill takes less than 2 seconds.  Neurological:     General: No focal deficit present.     Mental Status: He is alert and oriented to person, place, and time.     Motor: No weakness.  Psychiatric:        Mood and Affect: Mood normal.        Behavior: Behavior normal.        Thought Content: Thought content normal.    Assessment & Plan:   Problem List Items  Addressed This Visit       CAD (coronary artery disease) - Primary   History of NSTEMI in August 2012.  LHC revealed a mobile thrombus superimposed on plaque in the proximal LAD.  Treated with ticagrelor and heparin infusion.  Thrombus resolved.  He completed ticagrelor x 1 year.  Currently prescribed ASA 81 mg daily and atorvastatin  80 mg  daily.  Followed by Dr. Delford.  Denies recent chest pain.      Essential hypertension   He is currently prescribed losartan  50 mg daily.  BP is adequately controlled.  No medication changes are indicated today.      Chronic idiopathic constipation   Followed by gastroenterology.  Recently for follow-up.  Currently using MiraLAX  and has been provided samples of Linzess if needed.  Scheduled to undergo colonoscopy next month.      GERD (gastroesophageal reflux disease)   Followed by gastroenterology.  Symptoms are adequately controlled with Protonix  40 mg daily.      BPH (benign prostatic hyperplasia)   Followed by urology.  Symptoms are adequately controlled with Flomax.      CKD stage 3a, GFR 45-59 ml/min (HCC)   Recent labs are consistent with CKD 3A.  Currently on ARB.  Repeat labs ordered today.      MDD (major depressive disorder)   Mood is stable with fluoxetine 20 mg daily.  No medication changes are indicated today.      HLD (hyperlipidemia)   Currently prescribed atorvastatin  80 mg daily.  Repeat lipid panel ordered today      Return in about 6 months (around 05/19/2024).   Manus FORBES Fireman, MD

## 2023-11-20 NOTE — Assessment & Plan Note (Signed)
 Followed by urology.  Symptoms are adequately controlled with Flomax.

## 2023-11-21 ENCOUNTER — Ambulatory Visit (INDEPENDENT_AMBULATORY_CARE_PROVIDER_SITE_OTHER): Payer: Self-pay | Admitting: Neurology

## 2023-11-21 ENCOUNTER — Ambulatory Visit (INDEPENDENT_AMBULATORY_CARE_PROVIDER_SITE_OTHER): Payer: Medicare Other | Admitting: Neurology

## 2023-11-21 VITALS — BP 145/92 | HR 54 | Ht 69.0 in

## 2023-11-21 DIAGNOSIS — Z0289 Encounter for other administrative examinations: Secondary | ICD-10-CM

## 2023-11-21 DIAGNOSIS — M5412 Radiculopathy, cervical region: Secondary | ICD-10-CM

## 2023-11-21 DIAGNOSIS — M542 Cervicalgia: Secondary | ICD-10-CM | POA: Diagnosis not present

## 2023-11-21 NOTE — Progress Notes (Signed)
ASSESSMENT AND PLAN  Terry Powell is a 73 y.o. male   Chronic neck pain, limited coldness intermittent paresthesia  EMG nerve conduction study showed no active cervical radiculopathy, only mild right carpal tunnel syndromes,  He is very bothered by his symptoms, is willing to try anything to alleviate, referred to physical therapy, also pain management  DIAGNOSTIC DATA (LABS, IMAGING, TESTING) - I reviewed patient records, labs, notes, testing and imaging myself where available.   MEDICAL HISTORY:  Terry Powell is a 73 year old male, seen by [facility] for electrodiagnostic study, his primary care is   [place] primary care physician Dr. Durwin Nora, Lucina Mellow   History is obtained from the patient and review of electronic medical records. I personally reviewed pertinent available imaging films in PACS.   PMHx of  HTN Depression, anxiety HLD CAD Lumbar decompression surgery in 2009, severe low back pain, left lumbar radiculopathy,   Since 2023, he began to develop shooting pain from upper neck to occipital region, denies significant radiating pain to bilateral shoulder, finger stay cold, occasionally paresthesia, denies weakness, denies gait abnormality   MRI of cervical spine May 2023 showed multilevel degenerative changes, there was no significant canal stenosis, severe bilateral foraminal narrowing C4-5, moderate foraminal narrowing at C3-4  PHYSICAL EXAM:      11/21/2023   12:37 PM 11/20/2023    2:51 PM 11/14/2023    2:28 PM  Vitals with BMI  Height 5\' 9"  5\' 9"  5\' 9"   Weight  167 lbs 3 oz 170 lbs 3 oz  BMI  24.68 25.12  Systolic 145 119 540  Diastolic 92 78 84  Pulse 54 76 64    PHYSICAL EXAMNIATION:  Gen: NAD, conversant, well nourised, well groomed                     Cardiovascular: Regular rate rhythm, no peripheral edema, warm, nontender. Eyes: Conjunctivae clear without exudates or hemorrhage Neck: Supple, no carotid bruits. Pulmonary: Clear to  auscultation bilaterally   NEUROLOGICAL EXAM:  MENTAL STATUS: Speech/cognition: Awake, alert, oriented to history taking and casual conversation CRANIAL NERVES: CN II: Visual fields are full to confrontation. Pupils are round equal and briskly reactive to light. CN III, IV, VI: extraocular movement are normal. No ptosis. CN V: Facial sensation is intact to light touch CN VII: Face is symmetric with normal eye closure  CN VIII: Hearing is normal to causal conversation. CN IX, X: Phonation is normal. CN XI: Head turning and shoulder shrug are intact  MOTOR: There is no pronator drift of out-stretched arms. Muscle bulk and tone are normal. Muscle strength is normal.  REFLEXES: Reflexes are 2+ and symmetric at the biceps, triceps, knees, and ankles. Plantar responses are flexor.  SENSORY: Intact to light touch, pinprick and vibratory sensation are intact in fingers and toes.  COORDINATION: There is no trunk or limb dysmetria noted.  GAIT/STANCE: Posture is normal. Gait is steady   REVIEW OF SYSTEMS:  Full 14 system review of systems performed and notable only for as above All other review of systems were negative.   ALLERGIES: Allergies  Allergen Reactions   Iodinated Contrast Media Shortness Of Breath, Other (See Comments) and Anaphylaxis    Shakes. Evaluated and treated in ED. 1980s.  Patient has one kidney. Shakes. Evaluated and treated in ED. 1980s.  Patient has one kidney.     HOME MEDICATIONS: Current Outpatient Medications  Medication Sig Dispense Refill   aspirin EC 81 MG tablet Take 1  tablet (81 mg total) by mouth daily.     atorvastatin (LIPITOR) 80 MG tablet Take 1 tablet by mouth once daily 90 tablet 3   calcium carbonate (TUMS - DOSED IN MG ELEMENTAL CALCIUM) 500 MG chewable tablet Chew 1 tablet by mouth as needed for indigestion or heartburn.     cholecalciferol (VITAMIN D) 1000 units tablet Take 1,000 Units by mouth daily.     Colchicine (MITIGARE) 0.6  MG CAPS Take 2 pills at the first sign of gout.  Repeat dose at one hour Then one a day until gone (Patient taking differently: Take 2 tablets by mouth See admin instructions. Take 2 pills at the first sign of gout.  Repeat dose at one hour Then one a day until gone) 30 capsule 11   cyanocobalamin (VITAMIN B12) 1000 MCG tablet Take 1,000 mcg by mouth daily.     docusate sodium (COLACE) 100 MG capsule Take 300 mg by mouth daily as needed for moderate constipation.      FLUoxetine (PROZAC) 20 MG capsule Take 20 mg by mouth daily.      losartan (COZAAR) 50 MG tablet Take 1 tablet by mouth once daily 90 tablet 3   Na Sulfate-K Sulfate-Mg Sulf 17.5-3.13-1.6 GM/177ML SOLN Take 1 kit by mouth as directed. 354 mL 0   nitroGLYCERIN (NITROSTAT) 0.4 MG SL tablet Place 1 tablet (0.4 mg total) under the tongue every 5 (five) minutes as needed. 25 tablet 2   pantoprazole (PROTONIX) 40 MG tablet TAKE 1 TABLET BY MOUTH 30 MINUTES PRIOR TO FIRST MAIN MEAL OF THE DAY 90 tablet 0   polyethylene glycol (MIRALAX / GLYCOLAX) packet Take 17 g by mouth daily as needed.      No current facility-administered medications for this visit.    PAST MEDICAL HISTORY: Past Medical History:  Diagnosis Date   Allergy    IV dye, seasonal   Anxiety    Arthritis    Blood transfusion without reported diagnosis    Cataract    Clotting disorder (HCC)    Depression    since back surgery   GERD (gastroesophageal reflux disease) 04/30/2018   Gout    Hypercholesteremia    Hypertension    Myocardial infarction (HCC)    Solitary kidney, congenital     PAST SURGICAL HISTORY: Past Surgical History:  Procedure Laterality Date   ANKLE FRACTURE SURGERY     BACK SURGERY     BIOPSY  08/23/2018   Procedure: BIOPSY;  Surgeon: West Bali, MD;  [facility];  Service: Endoscopy;;  biopsy of cardia   COLONOSCOPY  06/08/2011   Dr. Darrick Penna: 2 sessile polyps removed from the descending colon.  One was lower adenoma.   Diverticular also seen.  Hemorrhoids.TCS recommended in 2017.    COLONOSCOPY N/A 08/23/2018   Surgeon: West Bali, MD; 8 mm tubular adenoma removed from the rectum, three 4-6 mm polyps removed from the descending and ascending colon (benign), diverticulosis, internal hemorrhoids.  Recommended 5-year repeat.   ESOPHAGEAL DILATION  06/08/2011   Procedure: ESOPHAGEAL DILATION;  Surgeon: Arlyce Harman, MD;  [facility];  Service: Endoscopy;;   ESOPHAGOGASTRODUODENOSCOPY  06/08/2011   Dr. Darrick Penna: Leftward duodenum, esophageal stricture status post dilation, hiatal hernia, gastritis/duodenitis..  No H. pylori on path.   ESOPHAGOGASTRODUODENOSCOPY N/A 08/23/2018   Surgeon: West Bali, MD; benign-appearing esophageal stricture due to uncontrolled GERD s/p dilation, gastritis/duodenitis due to ASA/NSAIDs biopsied, small hiatal hernia.  Pathology with inflamed squamoglandular mucosa,  no H. pylori.   POLYPECTOMY  08/23/2018   Procedure: POLYPECTOMY;  Surgeon: West Bali, MD;  [facility];  Service: Endoscopy;;  polyp at ascending colon, descending colon polyps x2, rectal polyp,   SAVORY DILATION N/A 08/23/2018   Procedure: SAVORY DILATION;  Surgeon: West Bali, MD;  [facility];  Service: Endoscopy;  Laterality: N/A;   SPINE SURGERY  1996   2-3 discs injured , fusion   TONSILLECTOMY     VASECTOMY      FAMILY HISTORY: Family History  Problem Relation Age of Onset   Ulcers Father 28       partial gastrectomy   Heart disease Father 68   Arthritis Father    Hyperlipidemia Father    Ulcers Mother        stomach   Arthritis Mother    Asthma Mother    COPD Mother    Depression Mother    Diabetes Mother    Hyperlipidemia Mother    Hypertension Mother    Kidney disease Mother    Colon cancer Maternal Grandfather 82   Stroke Maternal Grandfather    Cancer Maternal Grandfather 56       colon   Stroke Paternal Grandfather    Liver disease  Neg Hx     SOCIAL HISTORY: Social History   Socioeconomic History   Marital status: Single    Spouse name: Not on file   Number of children: 2   Years of education: 16   Highest education level: Not on file  Occupational History   Occupation: retired    Comment: state trooper  Tobacco Use   Smoking status: Never   Smokeless tobacco: Never  Vaping Use   Vaping status: Never Used  Substance and Sexual Activity   Alcohol use: No   Drug use: No   Sexual activity: Yes    Birth control/protection: Surgical  Other Topics Concern   Not on file  Social History Narrative   Retired Chartered loss adjuster   Lives alone   2 daughters - 2 grandsons   [place]   Works on [employer]   Social Drivers of Health   Financial Resource Strain: Low Risk  (10/16/2022)   Received from [facility], [facility]   Overall Financial Resource Strain (CARDIA)    Difficulty of Paying Living Expenses: Not very hard  Food Insecurity: No Food Insecurity (10/16/2022)   Received from [facility], [facility]   Hunger Vital Sign    Worried About Running Out of Food in the Last Year: Never true    Ran Out of Food in the Last Year: Never true  Transportation Needs: No Transportation Needs (10/16/2022)   Received from [facility], [facility]   [facility] - Transportation    Lack of Transportation (Medical): No    Lack of Transportation (Non-Medical): No  Physical Activity: Not on file  Stress: Not on file  Social Connections: Not on file  Intimate Partner Violence: Not At Risk (10/17/2022)   Received from [facility], [facility]   Humiliation, Afraid, Rape, and Kick questionnaire    Fear of Current or Ex-Partner: No    Emotionally Abused: No    Physically Abused: No    Sexually Abused: No      Levert Feinstein, M.D. Ph.D.  [facility] [address] [place], [place] [address] Ph: [phone] Fax: [phone]  CC:  Deloria Lair,  Charlane Ferretti,  MD [address] [place],  [place] [address]  Billie Lade, MD

## 2023-11-22 NOTE — Procedures (Signed)
Full Name: Terry Powell Gender: Male MRN #: [id number] Date of Birth: [birth year]    Visit Date: 11/21/2023 12:43 Age: 73 Years Examining Physician: Dr. Levert Feinstein Referring Physician: Dr. Levert Feinstein Height: 5 feet 9 inch History: 73 year old male with chronic neck pain radiating pain to occipital region, intermittent upper extremity paresthesia  Summary of the test: Nerve conduction study: Bilateral median, ulnar sensory responses showed mildly prolonged peak latency well-preserved snap amplitude.  Right median mixed response was 0.4 ms prolonged compared to ipsilateral ulnar mixed response.  Left side were within normal limit.  Bilateral ulnar and median motor responses were normal.  Electromyography:  Selected needle examination of bilateral upper extremity muscles and cervical paraspinal muscles were normal.    Conclusion: This is a slight abnormal study.  There is electrodiagnostic evidence of mild right carpal tunnel syndrome, there was no evidence of active cervical radiculopathy.    Levert Feinstein. M.D. Ph.D.   [facility] [address], [address] [place], [place] [address] Tel: [phone] Fax: [phone]  Verbal informed consent was obtained from the patient, patient was informed of potential risk of procedure, including bruising, bleeding, hematoma formation, infection, muscle weakness, muscle pain, numbness, among others.        MNC    Nerve / Sites Muscle Latency Ref. Amplitude Ref. Rel Amp Segments Distance Velocity Ref. Area    ms ms mV mV %  cm m/s m/s mVms  L Median - APB     Wrist APB 4.0 <=4.4 4.9 >=4.0 100 Wrist - APB 7   22.4     Upper arm APB 8.8  4.5  91.7 Upper arm - Wrist 24.6 52 >=49 21.9  R Median - APB     Wrist APB 4.2 <=4.4 6.8 >=4.0 100 Wrist - APB 7   30.7     Upper arm APB 8.5  6.7  98.9 Upper arm - Wrist 25.6 60 >=49 31.2  L Ulnar - ADM     Wrist ADM 3.1 <=3.3 6.3 >=6.0 100 Wrist - ADM 7   32.6     B.Elbow ADM 5.6   6.3  99.2 B.Elbow - Wrist 13 51 >=49 33.2     A.Elbow ADM 8.8  5.3  84.5 A.Elbow - B.Elbow 17 53 >=49 30.8  R Ulnar - ADM     Wrist ADM 3.2 <=3.3 7.6 >=6.0 100 Wrist - ADM 7   35.1     B.Elbow ADM 5.6  7.6  99.5 B.Elbow - Wrist 13 56 >=49 33.3     A.Elbow ADM 9.0  7.5  99.1 A.Elbow - B.Elbow 20 59 >=49 33.7             SNC    Nerve / Sites Rec. Site Peak Lat Ref.  Amp Ref. Segments Distance Peak Diff Ref.    ms ms V V  cm ms ms  L Median, Ulnar - Transcarpal comparison     Median Palm Wrist 2.3 <=2.2 48 >=35 Median Palm - Wrist 8       Ulnar Palm Wrist 2.2 <=2.2 12 >=12 Ulnar Palm - Wrist 8          Median Palm - Ulnar Palm  0.1 <=0.4  R Median, Ulnar - Transcarpal comparison     Median Palm Wrist 2.8 <=2.2 52 >=35 Median Palm - Wrist 8       Ulnar Palm Wrist 2.4 <=2.2 14 >=12 Ulnar Palm - Wrist 8  Median Palm - Ulnar Palm  0.4 <=0.4  L Median - Orthodromic (Dig II, Mid palm)     Dig II Wrist 3.9 <=3.4 10 >=10 Dig II - Wrist 14    R Median - Orthodromic (Dig II, Mid palm)     Dig II Wrist 3.8 <=3.4 10 >=10 Dig II - Wrist 13    L Ulnar - Orthodromic, (Dig V, Mid palm)     Dig V Wrist 3.7 <=3.1 5 >=5 Dig V - Wrist 11    R Ulnar - Orthodromic, (Dig V, Mid palm)     Dig V Wrist 3.7 <=3.1 7 >=5 Dig V - Wrist 80                   F  Wave    Nerve F Lat Ref.   ms ms  L Ulnar - ADM 30.3 <=32.0  R Ulnar - ADM 29.1 <=32.0         EMG Summary Table    Spontaneous MUAP Recruitment  Muscle IA Fib PSW Fasc Other Amp Dur. Poly Pattern  L. First dorsal interosseous Normal None None None _______ Normal Normal Normal Normal  L. Biceps brachii Normal None None None _______ Normal Normal Normal Normal  L. Deltoid Normal None None None _______ Normal Normal Normal Normal  L. Triceps brachii Normal None None None _______ Normal Normal Normal Normal  L. Brachioradialis Normal None None None _______ Normal Normal Normal Normal  R. First dorsal interosseous Normal None None None _______  Normal Normal Normal Normal  R. Triceps brachii Normal None None None _______ Normal Normal Normal Normal  R. Biceps brachii Normal None None None _______ Normal Normal Normal Normal  R. Deltoid Normal None None None _______ Normal Normal Normal Normal  R. Extensor digitorum communis Normal None None None _______ Normal Normal Normal Normal  R. Cervical paraspinals Normal None None None _______ Normal Normal Normal Normal  L. Cricothyroid Normal None None None _______ Normal Normal Normal Normal

## 2023-11-23 LAB — CBC WITH DIFFERENTIAL/PLATELET
Basophils Absolute: 0 10*3/uL (ref 0.0–0.2)
Basos: 1 %
EOS (ABSOLUTE): 0.3 10*3/uL (ref 0.0–0.4)
Eos: 5 %
Hematocrit: 49.9 % (ref 37.5–51.0)
Hemoglobin: 17.1 g/dL (ref 13.0–17.7)
Immature Grans (Abs): 0 10*3/uL (ref 0.0–0.1)
Immature Granulocytes: 0 %
Lymphocytes Absolute: 1.5 10*3/uL (ref 0.7–3.1)
Lymphs: 24 %
MCH: 30.8 pg (ref 26.6–33.0)
MCHC: 34.3 g/dL (ref 31.5–35.7)
MCV: 90 fL (ref 79–97)
Monocytes Absolute: 0.5 10*3/uL (ref 0.1–0.9)
Monocytes: 8 %
Neutrophils Absolute: 3.9 10*3/uL (ref 1.4–7.0)
Neutrophils: 62 %
Platelets: 190 10*3/uL (ref 150–450)
RBC: 5.55 x10E6/uL (ref 4.14–5.80)
RDW: 12.5 % (ref 11.6–15.4)
WBC: 6.2 10*3/uL (ref 3.4–10.8)

## 2023-11-23 LAB — CMP14+EGFR
ALT: 18 [IU]/L (ref 0–44)
AST: 21 [IU]/L (ref 0–40)
Albumin: 4.2 g/dL (ref 3.8–4.8)
Alkaline Phosphatase: 116 [IU]/L (ref 44–121)
BUN/Creatinine Ratio: 14 (ref 10–24)
BUN: 20 mg/dL (ref 8–27)
Bilirubin Total: 0.7 mg/dL (ref 0.0–1.2)
CO2: 24 mmol/L (ref 20–29)
Calcium: 9.6 mg/dL (ref 8.6–10.2)
Chloride: 104 mmol/L (ref 96–106)
Creatinine, Ser: 1.48 mg/dL — ABNORMAL HIGH (ref 0.76–1.27)
Globulin, Total: 2.1 g/dL (ref 1.5–4.5)
Glucose: 111 mg/dL — ABNORMAL HIGH (ref 70–99)
Potassium: 4.2 mmol/L (ref 3.5–5.2)
Sodium: 143 mmol/L (ref 134–144)
Total Protein: 6.3 g/dL (ref 6.0–8.5)
eGFR: 50 mL/min/{1.73_m2} — ABNORMAL LOW (ref >59)

## 2023-11-23 LAB — B12 AND FOLATE PANEL
Folate: 5.7 ng/mL (ref >3.0)
Vitamin B-12: 1157 pg/mL (ref 232–1245)

## 2023-11-23 LAB — VITAMIN D 25 HYDROXY (VIT D DEFICIENCY, FRACTURES): Vit D, 25-Hydroxy: 46.9 ng/mL (ref 30.0–100.0)

## 2023-11-23 LAB — HCV INTERPRETATION

## 2023-11-23 LAB — TSH+FREE T4
Free T4: 1.29 ng/dL (ref 0.82–1.77)
TSH: 2.77 u[IU]/mL (ref 0.450–4.500)

## 2023-11-23 LAB — HCV AB W REFLEX TO QUANT PCR: HCV Ab: NONREACTIVE

## 2023-11-23 LAB — HEMOGLOBIN A1C
Est. average glucose Bld gHb Est-mCnc: 126 mg/dL
Hgb A1c MFr Bld: 6 % — ABNORMAL HIGH (ref 4.8–5.6)

## 2023-11-23 LAB — LIPID PANEL
Chol/HDL Ratio: 3.2 {ratio} (ref 0.0–5.0)
Cholesterol, Total: 150 mg/dL (ref 100–199)
HDL: 47 mg/dL (ref >39)
LDL Chol Calc (NIH): 79 mg/dL (ref 0–99)
Triglycerides: 134 mg/dL (ref 0–149)
VLDL Cholesterol Cal: 24 mg/dL (ref 5–40)

## 2023-11-23 LAB — PSA: Prostate Specific Ag, Serum: 4.3 ng/mL — ABNORMAL HIGH (ref 0.0–4.0)

## 2023-11-26 ENCOUNTER — Telehealth: Payer: Self-pay | Admitting: Neurology

## 2023-11-26 NOTE — Telephone Encounter (Signed)
Referral for pain clinic fax to [facility]. Phone: [phone], Fax: [phone]. 

## 2023-12-03 ENCOUNTER — Telehealth: Payer: Self-pay | Admitting: *Deleted

## 2023-12-03 NOTE — Telephone Encounter (Signed)
Patient LVM  reschedule his procedure  Called pt back and LMOVM

## 2023-12-04 NOTE — Telephone Encounter (Signed)
Patient called in and wants to cancel procedure for now until his mom gets better. Message sent to endo

## 2023-12-04 NOTE — Progress Notes (Signed)
EMG nerve conduction study report is under procedure tab

## 2023-12-06 ENCOUNTER — Telehealth: Payer: Self-pay

## 2023-12-06 NOTE — Telephone Encounter (Signed)
Attempt to call results to patient, no answer. LVM to return call.

## 2023-12-06 NOTE — Progress Notes (Signed)
Please call and advise the patient that the recent nerve conduction study showed evidence of right sided carpal tunnel syndrome, no evidence of nerve compression at the level of the neck. Please continue to follow up with [facility] for additional management of the neck pain. Please remind patient to keep any upcoming appointments or tests and to call us with any interim questions, concerns, problems or updates. Thanks,   Windell Norfolk, MD

## 2023-12-10 NOTE — Telephone Encounter (Signed)
Pt aware of results and voiced gratitude and understanding of all discussed

## 2023-12-10 NOTE — Telephone Encounter (Signed)
Lmtrc. 2nd attempt.

## 2023-12-10 NOTE — Telephone Encounter (Signed)
Patient called back in from [facility] call. Please give him a call back at [phone]

## 2024-01-01 ENCOUNTER — Ambulatory Visit (HOSPITAL_COMMUNITY): Admit: 2024-01-01 | Payer: Medicare Other | Admitting: Internal Medicine

## 2024-01-01 ENCOUNTER — Encounter (HOSPITAL_COMMUNITY): Payer: Self-pay

## 2024-01-01 SURGERY — COLONOSCOPY WITH PROPOFOL
Anesthesia: Monitor Anesthesia Care

## 2024-01-15 ENCOUNTER — Other Ambulatory Visit: Payer: Self-pay | Admitting: Gastroenterology

## 2024-01-31 ENCOUNTER — Encounter: Payer: Self-pay | Admitting: *Deleted

## 2024-01-31 NOTE — Telephone Encounter (Signed)
 Pt has been rescheduled for 02/12/24. Updated instructions printed and give to pt while in office.

## 2024-02-11 ENCOUNTER — Telehealth: Payer: Self-pay | Admitting: *Deleted

## 2024-02-11 NOTE — Telephone Encounter (Signed)
 Pt called to cancel his procedure for tomorrow 02/12/24 due to having a bad sore throat and a bad cough. He says he will call back to reschedule once he is feeling better.

## 2024-02-12 ENCOUNTER — Ambulatory Visit (HOSPITAL_COMMUNITY): Admission: RE | Admit: 2024-02-12 | Source: Home / Self Care | Admitting: Internal Medicine

## 2024-02-12 ENCOUNTER — Encounter (HOSPITAL_COMMUNITY): Admission: RE | Payer: Self-pay | Source: Home / Self Care

## 2024-02-12 SURGERY — COLONOSCOPY
Anesthesia: Choice

## 2024-02-15 ENCOUNTER — Other Ambulatory Visit: Payer: Self-pay | Admitting: Cardiovascular Disease

## 2024-02-15 NOTE — Telephone Encounter (Signed)
 This is a [place] pt.

## 2024-02-16 ENCOUNTER — Other Ambulatory Visit: Payer: Self-pay | Admitting: Cardiovascular Disease

## 2024-03-17 ENCOUNTER — Other Ambulatory Visit: Payer: Self-pay | Admitting: Cardiovascular Disease

## 2024-04-01 ENCOUNTER — Telehealth (INDEPENDENT_AMBULATORY_CARE_PROVIDER_SITE_OTHER): Payer: Self-pay | Admitting: Internal Medicine

## 2024-04-01 NOTE — Telephone Encounter (Signed)
 Patient came to front desk asking to transfer his GI care over to Dr Sammi Crick. I told him I would have to get with the doctor to see if he could take him as a new patient. Patient  agreed. [phone]

## 2024-04-07 ENCOUNTER — Ambulatory Visit: Payer: Self-pay

## 2024-04-07 NOTE — Telephone Encounter (Signed)
  Chief Complaint: suspected shingles rash Symptoms: right face/under right eye, right eye blurry vision Frequency: x about 1 week Pertinent Negatives: Patient denies loss of vision in right eye, fever Disposition: [] ED /[x] Urgent Care (no appt availability in office) / [] Appointment(In office/virtual)/ []  [facility]/ [] Home Care/ [] Refused Recommended Disposition /[] [place] [facility]/ []  Follow-up with PCP Additional Notes: Called [facility] and confirmed no appointments available until Wednesday. Patient agreeable to go to urgent care.  Copied from CRM [id number]. Topic: Clinical - Red Word Triage >> Apr 07, 2024 11:17 AM Hamp Levine R wrote: [facility] that prompted transfer to Nurse Triage: Patient states he has blisters near and around his right eye that are very painful. Thinks it may be shingles. Has had a few days and not getting any better. Reason for Disposition  [1] Shingles rash of face AND [2] eye pain or blurred vision  Answer Assessment - Initial Assessment Questions 1. APPEARANCE of RASH: "Describe the rash."      Blistered, red rash.  2. LOCATION: "Where is the rash located?"      Right cheek of face spreading near right eye  3. ONSET: "When did the rash start?"      Within the past week, unsure the day.  4. ITCHING: "Does the rash itch?" If Yes, ask: "How bad is the itch?"  (Scale 1-10; or mild, moderate, severe)     Yes, mild itching.  5. PAIN: "Does the rash hurt?" If Yes, ask: "How bad is the pain?"  (Scale 0-10; or none, mild, moderate, severe)    - NONE (0): no pain    - MILD (1-3): doesn't interfere with normal activities     - MODERATE (4-7): interferes with normal activities or awakens from sleep     - SEVERE (8-10): excruciating pain, unable to do any normal activities     Stinging pain, moderate.  6. OTHER SYMPTOMS: "Do you have any other symptoms?" (e.g., fever)     Blurry vision in right eye  7. PREGNANCY: "Is there any chance you are pregnant?"  "When was your last menstrual period?"     N/A.  Protocols used: Shingles (Zoster)-A-AH

## 2024-04-10 ENCOUNTER — Telehealth (INDEPENDENT_AMBULATORY_CARE_PROVIDER_SITE_OTHER): Payer: Self-pay | Admitting: Gastroenterology

## 2024-04-10 NOTE — Telephone Encounter (Signed)
 Patient wanted to switch over to Dr Sammi Crick from Dr Mordechai April. Both approved. Patient is aware of OV in July with Dr Sammi Crick. He said Dr Mordechai April wanted him to have a colonoscopy and wanted to speak with Dr Sammi Crick prior to scheduling.

## 2024-04-10 NOTE — Telephone Encounter (Signed)
Noted. Thanks,

## 2024-04-11 NOTE — Telephone Encounter (Signed)
 Noted, thanks!

## 2024-05-12 ENCOUNTER — Other Ambulatory Visit: Payer: Self-pay | Admitting: Cardiovascular Disease

## 2024-05-15 ENCOUNTER — Encounter (INDEPENDENT_AMBULATORY_CARE_PROVIDER_SITE_OTHER): Payer: Self-pay | Admitting: Gastroenterology

## 2024-05-15 ENCOUNTER — Encounter (INDEPENDENT_AMBULATORY_CARE_PROVIDER_SITE_OTHER): Payer: Self-pay | Admitting: *Deleted

## 2024-05-15 ENCOUNTER — Ambulatory Visit (INDEPENDENT_AMBULATORY_CARE_PROVIDER_SITE_OTHER): Admitting: Gastroenterology

## 2024-05-15 VITALS — BP 129/86 | HR 62 | Temp 97.1°F | Ht 69.0 in | Wt 165.7 lb

## 2024-05-15 DIAGNOSIS — K219 Gastro-esophageal reflux disease without esophagitis: Secondary | ICD-10-CM

## 2024-05-15 DIAGNOSIS — Z860100 Personal history of colon polyps, unspecified: Secondary | ICD-10-CM | POA: Diagnosis not present

## 2024-05-15 DIAGNOSIS — K5909 Other constipation: Secondary | ICD-10-CM | POA: Diagnosis not present

## 2024-05-15 DIAGNOSIS — K5904 Chronic idiopathic constipation: Secondary | ICD-10-CM

## 2024-05-15 NOTE — Patient Instructions (Signed)
 Continue pantoprazole  40 mg daily Schedule colonoscopy Can increase use of MiraLAX  to 2 capfuls per day.  Max dose is 3 capfuls per day.

## 2024-05-15 NOTE — H&P (View-Only) (Signed)
 Toribio Fortune, M.D. Gastroenterology & Hepatology [facility] [address] [place], [place] [address]  Primary Care Physician: No primary care provider on file. No primary provider on file.  I will communicate my assessment and recommendations to the referring MD via EMR.  Problems: GERD  History of Present Illness: Terry Powell is a 73 y.o. male with past medical history of GERD complicated by esophageal strictures, constipation, gout, hypertension, hyperlipidemia, myocardial infarction, depression, who presents for evaluation of GERD and screening colonoscopy.  The patient was last seen on 11/14/2023 by Dr. Cindie.  At that time, the patient was given samples of Linzess 72 mcg daily for constipation and was continued on pantoprazole  40 mg daily.  He was scheduled for colonoscopy but he canceled this procedure.  He requested to have a second opinion with me.  Patient reports that he is taking 1.5 capful of Miralax  for constipation which is making him have a bowel movement every couple of days.  Patient reports that he is taking pantoprazole  40 mg qday. States that he has tried in the past to stop the medication or use a 20 mg dose, but he did have recurrent symptoms. No dysphagia or odynophagia. However, he reports that as long as he takes pantoprazole  40 mg qday, he is asymptomatic.  The patient denies having any nausea, vomiting, fever, chills, hematochezia, melena, hematemesis, abdominal distention, abdominal pain, diarrhea, jaundice, pruritus . Has lost 5 lb but does not know why.  Last EGD: 08/23/2018 One benign- appearing, intrinsic moderate ( circumferential scarring or stenosis; an endoscope may pass) stenosis was found. This stenosis measured 1. 2 cm ( inner diameter) . The stenosis was traversed. A guidewire was placed and the scope was withdrawn. Dilation was performed with a Savary dilator with mild resistance at 12. 8 mm and  moderate resistance at 14 mm, 15 mm and 16 mm. Estimated blood loss was minimal.  Patchy mild inflammation characterized by congestion ( edema) and erythema was found in the entire examined stomach. Biopsies were taken with a cold forceps for Helicobacter pylori testing.  Patchy mild inflammation characterized by congestion ( edema) and erythema was found in the entire duodenum.  Last Colonoscopy: 08/23/2018 - One 8 mm INFLAMMATORY APPEARING polyp in the rectum, removed with a hot snare. Resected and retrieved. - Three 4 to 6 mm polyps in the descending colon( 2) and in the ascending colon, removed with a cold snare. Resected and retrieved. - Diverticulosis in the recto- sigmoid colon, in the sigmoid colon, in the ascending colon and in the cecum. - Internal hemorrhoids. - There was significant looping of the colon.  Path: 1. Colon, polyp(s), ascending; descending x 2 - BENIGN POLYPOID COLORECTAL MUCOSA. - THERE IS NO EVIDENCE OF MALIGNANCY. 2. Rectum, polyp(s) - TUBULAR ADENOMA(S). - HIGH GRADE DYSPLASIA IS NOT IDENTIFIED. 3. Stomach, biopsy - INFLAMED SQUAMOGLANDULAR MUCOSA. - THERE IS NO EVIDENCE OF HELICOBACTER PYLORI, DYSPLASIA, OR MALIGNANCY. - SEE COMMENT.  Past Medical History: Past Medical History:  Diagnosis Date   Allergy    IV dye, seasonal   Anxiety    Arthritis    Blood transfusion without reported diagnosis    Cataract    Clotting disorder (HCC)    Depression    since back surgery   GERD (gastroesophageal reflux disease) 04/30/2018   Gout    Hypercholesteremia    Hypertension    Myocardial infarction [facility])    Solitary kidney, congenital     Past Surgical History: Past  Surgical History:  Procedure Laterality Date   ANKLE FRACTURE SURGERY     BACK SURGERY     BIOPSY  08/23/2018   Procedure: BIOPSY;  Surgeon: Harvey Margo CROME, MD;  [facility];  Service: Endoscopy;;  biopsy of cardia   COLONOSCOPY  06/08/2011   Dr. harvey: 2 sessile polyps removed  from the descending colon.  One was lower adenoma.  Diverticular also seen.  Hemorrhoids.TCS recommended in 2017.    COLONOSCOPY N/A 08/23/2018   Surgeon: Harvey Margo CROME, MD; 8 mm tubular adenoma removed from the rectum, three 4-6 mm polyps removed from the descending and ascending colon (benign), diverticulosis, internal hemorrhoids.  Recommended 5-year repeat.   ESOPHAGEAL DILATION  06/08/2011   Procedure: ESOPHAGEAL DILATION;  Surgeon: Margo CHRISTELLA Harvey, MD;  [facility];  Service: Endoscopy;;   ESOPHAGOGASTRODUODENOSCOPY  06/08/2011   Dr. harvey: Leftward duodenum, esophageal stricture status post dilation, hiatal hernia, gastritis/duodenitis..  No H. pylori on path.   ESOPHAGOGASTRODUODENOSCOPY N/A 08/23/2018   Surgeon: Harvey Margo CROME, MD; benign-appearing esophageal stricture due to uncontrolled GERD s/p dilation, gastritis/duodenitis due to ASA/NSAIDs biopsied, small hiatal hernia.  Pathology with inflamed squamoglandular mucosa, no H. pylori.   POLYPECTOMY  08/23/2018   Procedure: POLYPECTOMY;  Surgeon: Harvey Margo CROME, MD;  [facility];  Service: Endoscopy;;  polyp at ascending colon, descending colon polyps x2, rectal polyp,   SAVORY DILATION N/A 08/23/2018   Procedure: SAVORY DILATION;  Surgeon: Harvey Margo CROME, MD;  [facility];  Service: Endoscopy;  Laterality: N/A;   SPINE SURGERY  1996   2-3 discs injured , fusion   TONSILLECTOMY     VASECTOMY      Family History: Family History  Problem Relation Age of Onset   Ulcers Father 63       partial gastrectomy   Heart disease Father 63   Arthritis Father    Hyperlipidemia Father    Ulcers Mother        stomach   Arthritis Mother    Asthma Mother    COPD Mother    Depression Mother    Diabetes Mother    Hyperlipidemia Mother    Hypertension Mother    Kidney disease Mother    Colon cancer Maternal Grandfather 56   Stroke Maternal Grandfather    Cancer Maternal Grandfather 95       colon    Stroke Paternal Grandfather    Liver disease Neg Hx     Social History: Social History   Tobacco Use  Smoking Status Never  Smokeless Tobacco Never   Social History   Substance and Sexual Activity  Alcohol  Use No   Social History   Substance and Sexual Activity  Drug Use No    Allergies: Allergies  Allergen Reactions   Iodinated Contrast Media Shortness Of Breath, Other (See Comments) and Anaphylaxis    Shakes. Evaluated and treated in ED. 1980s.  Patient has one kidney. Shakes. Evaluated and treated in ED. 1980s.  Patient has one kidney.     Medications: Current Outpatient Medications  Medication Sig Dispense Refill   aspirin EC 81 MG tablet Take 1 tablet (81 mg total) by mouth daily.     atorvastatin  (LIPITOR) 80 MG tablet Take 1 tablet by mouth once daily 30 tablet 0   calcium  carbonate (TUMS - DOSED IN MG ELEMENTAL CALCIUM ) 500 MG chewable tablet Chew 1 tablet by mouth as needed for indigestion or heartburn.     cholecalciferol (  VITAMIN D ) 1000 units tablet Take 1,000 Units by mouth daily.     Colchicine  (MITIGARE ) 0.6 MG CAPS Take 2 pills at the first sign of gout.  Repeat dose at one hour Then one a day until gone 30 capsule 11   cyanocobalamin (VITAMIN B12) 1000 MCG tablet Take 1,000 mcg by mouth daily.     docusate sodium (COLACE) 100 MG capsule Take 300 mg by mouth daily as needed for moderate constipation.      FLUoxetine (PROZAC) 20 MG capsule Take 20 mg by mouth daily.      losartan  (COZAAR ) 50 MG tablet Take 1 tablet by mouth once daily 30 tablet 0   nitroGLYCERIN  (NITROSTAT ) 0.4 MG SL tablet Place 1 tablet (0.4 mg total) under the tongue every 5 (five) minutes as needed. 25 tablet 2   pantoprazole  (PROTONIX ) 40 MG tablet TAKE 1 TABLET BY MOUTH 30 MINUTES PRIOR TO FIRST MAIN MEAL OF THE DAY 90 tablet 3   polyethylene glycol (MIRALAX  / GLYCOLAX ) packet Take 17 g by mouth daily as needed.  (Patient taking differently: Take 17 g by mouth daily.)      tamsulosin (FLOMAX) 0.4 MG CAPS capsule Take 0.4 mg by mouth daily.     Na Sulfate-K Sulfate-Mg Sulf 17.5-3.13-1.6 GM/177ML SOLN Take 1 kit by mouth as directed. 354 mL 0   No current facility-administered medications for this visit.    Review of Systems: GENERAL: negative for malaise, night sweats HEENT: No changes in hearing or vision, no nose bleeds or other nasal problems. NECK: Negative for lumps, goiter, pain and significant neck swelling RESPIRATORY: Negative for cough, wheezing CARDIOVASCULAR: Negative for chest pain, leg swelling, palpitations, orthopnea GI: SEE HPI MUSCULOSKELETAL: Negative for joint pain or swelling, back pain, and muscle pain. SKIN: Negative for lesions, rash PSYCH: Negative for sleep disturbance, mood disorder and recent psychosocial stressors. HEMATOLOGY Negative for prolonged bleeding, bruising easily, and swollen nodes. ENDOCRINE: Negative for cold or heat intolerance, polyuria, polydipsia and goiter. NEURO: negative for tremor, gait imbalance, syncope and seizures. The remainder of the review of systems is noncontributory.   Physical Exam: BP 129/86 (BP Location: Left Arm, Patient Position: Sitting, Cuff Size: Normal)   Pulse 62   Temp (!) 97.1 F (36.2 C) (Temporal)   Ht 5' 9 (1.753 m)   Wt 165 lb 11.2 oz (75.2 kg)   BMI 24.47 kg/m  GENERAL: The patient is AO x3, in no acute distress. HEENT: Head is normocephalic and atraumatic. EOMI are intact. Mouth is well hydrated and without lesions. NECK: Supple. No masses LUNGS: Clear to auscultation. No presence of rhonchi/wheezing/rales. Adequate chest expansion HEART: RRR, normal s1 and s2. ABDOMEN: Soft, nontender, no guarding, no peritoneal signs, and nondistended. BS +. No masses. EXTREMITIES: Without any cyanosis, clubbing, rash, lesions or edema. NEUROLOGIC: AOx3, no focal motor deficit. SKIN: no jaundice, no rashes  Imaging/Labs: as above  I personally reviewed and interpreted the  available labs, imaging and endoscopic files.  Impression and Plan: Terry Powell is a 73 y.o. male with past medical history of GERD complicated by esophageal strictures, constipation, gout, hypertension, hyperlipidemia, myocardial infarction, depression, who presents for evaluation of GERD and screening colonoscopy.  Patient has presented adequate control of his GERD while on pantoprazole  40 mg daily.  He did not have adequate control of his symptoms with a lower dose of PPI.  We discussed the possibility of continuing this medication long-term, especially as he had evidence of stricturing disease in the past.  Not  presenting any dysphagia at the moment.  If he is ever interested in stopping this medicine, can discuss TIF as an option.  He is presenting with chronic history of constipation which has partially improved with use of MiraLAX .  I advised him to increase the dosage of this medication 2-3 capfuls per day.  He is due for colorectal cancer screening given his history of colon polyps, we will schedule him for a colonoscopy.  -Continue pantoprazole  40 mg daily -Schedule colonoscopy -Can increase use of MiraLAX  to 2 capfuls per day.  Max dose is 3 capfuls per day.  All questions were answered.      Toribio Fortune, MD Gastroenterology and Hepatology [facility]

## 2024-05-15 NOTE — Progress Notes (Signed)
 Toribio Fortune, M.D. Gastroenterology & Hepatology [facility] [address] [place], [place] [address]  Primary Care Physician: No primary care provider on file. No primary provider on file.  I will communicate my assessment and recommendations to the referring MD via EMR.  Problems: GERD  History of Present Illness: Terry Powell is a 73 y.o. male with past medical history of GERD complicated by esophageal strictures, constipation, gout, hypertension, hyperlipidemia, myocardial infarction, depression, who presents for evaluation of GERD and screening colonoscopy.  The patient was last seen on 11/14/2023 by Dr. Cindie.  At that time, the patient was given samples of Linzess 72 mcg daily for constipation and was continued on pantoprazole  40 mg daily.  He was scheduled for colonoscopy but he canceled this procedure.  He requested to have a second opinion with me.  Patient reports that he is taking 1.5 capful of Miralax  for constipation which is making him have a bowel movement every couple of days.  Patient reports that he is taking pantoprazole  40 mg qday. States that he has tried in the past to stop the medication or use a 20 mg dose, but he did have recurrent symptoms. No dysphagia or odynophagia. However, he reports that as long as he takes pantoprazole  40 mg qday, he is asymptomatic.  The patient denies having any nausea, vomiting, fever, chills, hematochezia, melena, hematemesis, abdominal distention, abdominal pain, diarrhea, jaundice, pruritus . Has lost 5 lb but does not know why.  Last EGD: 08/23/2018 One benign- appearing, intrinsic moderate ( circumferential scarring or stenosis; an endoscope may pass) stenosis was found. This stenosis measured 1. 2 cm ( inner diameter) . The stenosis was traversed. A guidewire was placed and the scope was withdrawn. Dilation was performed with a Savary dilator with mild resistance at 12. 8 mm and  moderate resistance at 14 mm, 15 mm and 16 mm. Estimated blood loss was minimal.  Patchy mild inflammation characterized by congestion ( edema) and erythema was found in the entire examined stomach. Biopsies were taken with a cold forceps for Helicobacter pylori testing.  Patchy mild inflammation characterized by congestion ( edema) and erythema was found in the entire duodenum.  Last Colonoscopy: 08/23/2018 - One 8 mm INFLAMMATORY APPEARING polyp in the rectum, removed with a hot snare. Resected and retrieved. - Three 4 to 6 mm polyps in the descending colon( 2) and in the ascending colon, removed with a cold snare. Resected and retrieved. - Diverticulosis in the recto- sigmoid colon, in the sigmoid colon, in the ascending colon and in the cecum. - Internal hemorrhoids. - There was significant looping of the colon.  Path: 1. Colon, polyp(s), ascending; descending x 2 - BENIGN POLYPOID COLORECTAL MUCOSA. - THERE IS NO EVIDENCE OF MALIGNANCY. 2. Rectum, polyp(s) - TUBULAR ADENOMA(S). - HIGH GRADE DYSPLASIA IS NOT IDENTIFIED. 3. Stomach, biopsy - INFLAMED SQUAMOGLANDULAR MUCOSA. - THERE IS NO EVIDENCE OF HELICOBACTER PYLORI, DYSPLASIA, OR MALIGNANCY. - SEE COMMENT.  Past Medical History: Past Medical History:  Diagnosis Date   Allergy    IV dye, seasonal   Anxiety    Arthritis    Blood transfusion without reported diagnosis    Cataract    Clotting disorder (HCC)    Depression    since back surgery   GERD (gastroesophageal reflux disease) 04/30/2018   Gout    Hypercholesteremia    Hypertension    Myocardial infarction [facility])    Solitary kidney, congenital     Past Surgical History: Past  Surgical History:  Procedure Laterality Date   ANKLE FRACTURE SURGERY     BACK SURGERY     BIOPSY  08/23/2018   Procedure: BIOPSY;  Surgeon: Harvey Margo CROME, MD;  [facility];  Service: Endoscopy;;  biopsy of cardia   COLONOSCOPY  06/08/2011   Dr. harvey: 2 sessile polyps removed  from the descending colon.  One was lower adenoma.  Diverticular also seen.  Hemorrhoids.TCS recommended in 2017.    COLONOSCOPY N/A 08/23/2018   Surgeon: Harvey Margo CROME, MD; 8 mm tubular adenoma removed from the rectum, three 4-6 mm polyps removed from the descending and ascending colon (benign), diverticulosis, internal hemorrhoids.  Recommended 5-year repeat.   ESOPHAGEAL DILATION  06/08/2011   Procedure: ESOPHAGEAL DILATION;  Surgeon: Margo CHRISTELLA Harvey, MD;  [facility];  Service: Endoscopy;;   ESOPHAGOGASTRODUODENOSCOPY  06/08/2011   Dr. harvey: Leftward duodenum, esophageal stricture status post dilation, hiatal hernia, gastritis/duodenitis..  No H. pylori on path.   ESOPHAGOGASTRODUODENOSCOPY N/A 08/23/2018   Surgeon: Harvey Margo CROME, MD; benign-appearing esophageal stricture due to uncontrolled GERD s/p dilation, gastritis/duodenitis due to ASA/NSAIDs biopsied, small hiatal hernia.  Pathology with inflamed squamoglandular mucosa, no H. pylori.   POLYPECTOMY  08/23/2018   Procedure: POLYPECTOMY;  Surgeon: Harvey Margo CROME, MD;  [facility];  Service: Endoscopy;;  polyp at ascending colon, descending colon polyps x2, rectal polyp,   SAVORY DILATION N/A 08/23/2018   Procedure: SAVORY DILATION;  Surgeon: Harvey Margo CROME, MD;  [facility];  Service: Endoscopy;  Laterality: N/A;   SPINE SURGERY  1996   2-3 discs injured , fusion   TONSILLECTOMY     VASECTOMY      Family History: Family History  Problem Relation Age of Onset   Ulcers Father 63       partial gastrectomy   Heart disease Father 63   Arthritis Father    Hyperlipidemia Father    Ulcers Mother        stomach   Arthritis Mother    Asthma Mother    COPD Mother    Depression Mother    Diabetes Mother    Hyperlipidemia Mother    Hypertension Mother    Kidney disease Mother    Colon cancer Maternal Grandfather 56   Stroke Maternal Grandfather    Cancer Maternal Grandfather 95       colon    Stroke Paternal Grandfather    Liver disease Neg Hx     Social History: Social History   Tobacco Use  Smoking Status Never  Smokeless Tobacco Never   Social History   Substance and Sexual Activity  Alcohol  Use No   Social History   Substance and Sexual Activity  Drug Use No    Allergies: Allergies  Allergen Reactions   Iodinated Contrast Media Shortness Of Breath, Other (See Comments) and Anaphylaxis    Shakes. Evaluated and treated in ED. 1980s.  Patient has one kidney. Shakes. Evaluated and treated in ED. 1980s.  Patient has one kidney.     Medications: Current Outpatient Medications  Medication Sig Dispense Refill   aspirin EC 81 MG tablet Take 1 tablet (81 mg total) by mouth daily.     atorvastatin  (LIPITOR) 80 MG tablet Take 1 tablet by mouth once daily 30 tablet 0   calcium  carbonate (TUMS - DOSED IN MG ELEMENTAL CALCIUM ) 500 MG chewable tablet Chew 1 tablet by mouth as needed for indigestion or heartburn.     cholecalciferol (  VITAMIN D ) 1000 units tablet Take 1,000 Units by mouth daily.     Colchicine  (MITIGARE ) 0.6 MG CAPS Take 2 pills at the first sign of gout.  Repeat dose at one hour Then one a day until gone 30 capsule 11   cyanocobalamin (VITAMIN B12) 1000 MCG tablet Take 1,000 mcg by mouth daily.     docusate sodium (COLACE) 100 MG capsule Take 300 mg by mouth daily as needed for moderate constipation.      FLUoxetine (PROZAC) 20 MG capsule Take 20 mg by mouth daily.      losartan  (COZAAR ) 50 MG tablet Take 1 tablet by mouth once daily 30 tablet 0   nitroGLYCERIN  (NITROSTAT ) 0.4 MG SL tablet Place 1 tablet (0.4 mg total) under the tongue every 5 (five) minutes as needed. 25 tablet 2   pantoprazole  (PROTONIX ) 40 MG tablet TAKE 1 TABLET BY MOUTH 30 MINUTES PRIOR TO FIRST MAIN MEAL OF THE DAY 90 tablet 3   polyethylene glycol (MIRALAX  / GLYCOLAX ) packet Take 17 g by mouth daily as needed.  (Patient taking differently: Take 17 g by mouth daily.)      tamsulosin (FLOMAX) 0.4 MG CAPS capsule Take 0.4 mg by mouth daily.     Na Sulfate-K Sulfate-Mg Sulf 17.5-3.13-1.6 GM/177ML SOLN Take 1 kit by mouth as directed. 354 mL 0   No current facility-administered medications for this visit.    Review of Systems: GENERAL: negative for malaise, night sweats HEENT: No changes in hearing or vision, no nose bleeds or other nasal problems. NECK: Negative for lumps, goiter, pain and significant neck swelling RESPIRATORY: Negative for cough, wheezing CARDIOVASCULAR: Negative for chest pain, leg swelling, palpitations, orthopnea GI: SEE HPI MUSCULOSKELETAL: Negative for joint pain or swelling, back pain, and muscle pain. SKIN: Negative for lesions, rash PSYCH: Negative for sleep disturbance, mood disorder and recent psychosocial stressors. HEMATOLOGY Negative for prolonged bleeding, bruising easily, and swollen nodes. ENDOCRINE: Negative for cold or heat intolerance, polyuria, polydipsia and goiter. NEURO: negative for tremor, gait imbalance, syncope and seizures. The remainder of the review of systems is noncontributory.   Physical Exam: BP 129/86 (BP Location: Left Arm, Patient Position: Sitting, Cuff Size: Normal)   Pulse 62   Temp (!) 97.1 F (36.2 C) (Temporal)   Ht 5' 9 (1.753 m)   Wt 165 lb 11.2 oz (75.2 kg)   BMI 24.47 kg/m  GENERAL: The patient is AO x3, in no acute distress. HEENT: Head is normocephalic and atraumatic. EOMI are intact. Mouth is well hydrated and without lesions. NECK: Supple. No masses LUNGS: Clear to auscultation. No presence of rhonchi/wheezing/rales. Adequate chest expansion HEART: RRR, normal s1 and s2. ABDOMEN: Soft, nontender, no guarding, no peritoneal signs, and nondistended. BS +. No masses. EXTREMITIES: Without any cyanosis, clubbing, rash, lesions or edema. NEUROLOGIC: AOx3, no focal motor deficit. SKIN: no jaundice, no rashes  Imaging/Labs: as above  I personally reviewed and interpreted the  available labs, imaging and endoscopic files.  Impression and Plan: Terry Powell is a 73 y.o. male with past medical history of GERD complicated by esophageal strictures, constipation, gout, hypertension, hyperlipidemia, myocardial infarction, depression, who presents for evaluation of GERD and screening colonoscopy.  Patient has presented adequate control of his GERD while on pantoprazole  40 mg daily.  He did not have adequate control of his symptoms with a lower dose of PPI.  We discussed the possibility of continuing this medication long-term, especially as he had evidence of stricturing disease in the past.  Not  presenting any dysphagia at the moment.  If he is ever interested in stopping this medicine, can discuss TIF as an option.  He is presenting with chronic history of constipation which has partially improved with use of MiraLAX .  I advised him to increase the dosage of this medication 2-3 capfuls per day.  He is due for colorectal cancer screening given his history of colon polyps, we will schedule him for a colonoscopy.  -Continue pantoprazole  40 mg daily -Schedule colonoscopy -Can increase use of MiraLAX  to 2 capfuls per day.  Max dose is 3 capfuls per day.  All questions were answered.      Toribio Fortune, MD Gastroenterology and Hepatology [facility]

## 2024-05-19 ENCOUNTER — Ambulatory Visit: Payer: Medicare Other | Admitting: Internal Medicine

## 2024-05-20 ENCOUNTER — Ambulatory Visit

## 2024-05-20 VITALS — BP 121/77 | HR 69 | Ht 69.0 in | Wt 164.1 lb

## 2024-05-20 DIAGNOSIS — R7303 Prediabetes: Secondary | ICD-10-CM | POA: Diagnosis not present

## 2024-05-20 DIAGNOSIS — E7849 Other hyperlipidemia: Secondary | ICD-10-CM | POA: Diagnosis not present

## 2024-05-20 DIAGNOSIS — I10 Essential (primary) hypertension: Secondary | ICD-10-CM

## 2024-05-20 DIAGNOSIS — N1831 Chronic kidney disease, stage 3a: Secondary | ICD-10-CM

## 2024-05-20 NOTE — Progress Notes (Unsigned)
 Established Patient Office Visit  Subjective   Patient ID: Terry Powell, male    DOB: 04/28/1951  Age: 73 y.o. MRN: [id number]  Chief Complaint  Patient presents with   Medical Management of Chronic Issues    6 month follow up    HPI  Patient Active Problem List   Diagnosis Date Noted   Prediabetes 05/20/2024   BPH (benign prostatic hyperplasia) 11/20/2023   Essential hypertension 11/20/2023   CKD stage 3a, GFR 45-59 ml/min (HCC) 11/20/2023   GERD (gastroesophageal reflux disease) 04/30/2018   History of colonic polyps 04/30/2018   Degenerative joint disease (DJD) of lumbar spine 01/11/2018   History of back surgery 01/11/2018   Acute idiopathic gout involving toe of right foot 01/11/2018   Adenomatous polyp of descending colon 05/10/2017   Bradycardia 06/11/2013   CAD (coronary artery disease) 07/27/2011   Hyperlipidemia 07/27/2011   Chronic idiopathic constipation 05/22/2011   MDD (major depressive disorder) 07/08/2010   HLD (hyperlipidemia) 07/08/2010    ROS    Objective:     BP 121/77   Pulse 69   Ht 5' 9 (1.753 m)   Wt 164 lb 1.3 oz (74.4 kg)   SpO2 95%   BMI 24.23 kg/m  BP Readings from Last 3 Encounters:  05/20/24 121/77  05/15/24 129/86  11/21/23 (!) 145/92   Wt Readings from Last 3 Encounters:  05/20/24 164 lb 1.3 oz (74.4 kg)  05/15/24 165 lb 11.2 oz (75.2 kg)  11/20/23 167 lb 3.2 oz (75.8 kg)      Physical Exam Vitals and nursing note reviewed.  Constitutional:      Appearance: Normal appearance.  HENT:     Head: Normocephalic.  Eyes:     Extraocular Movements: Extraocular movements intact.     Pupils: Pupils are equal, round, and reactive to light.  Cardiovascular:     Rate and Rhythm: Normal rate and regular rhythm.  Pulmonary:     Effort: Pulmonary effort is normal.     Breath sounds: Normal breath sounds.  Musculoskeletal:     Cervical back: Normal range of motion and neck supple.  Neurological:     Mental Status: He is  alert and oriented to person, place, and time.     Motor: No weakness.     Gait: Gait normal.  Psychiatric:        Mood and Affect: Mood normal.        Thought Content: Thought content normal.      No results found for any visits on 05/20/24.  Last CBC Lab Results  Component Value Date   WBC 6.2 11/21/2023   HGB 17.1 11/21/2023   HCT 49.9 11/21/2023   MCV 90 11/21/2023   MCH 30.8 11/21/2023   RDW 12.5 11/21/2023   PLT 190 11/21/2023   Last metabolic panel Lab Results  Component Value Date   GLUCOSE 111 (H) 11/21/2023   NA 143 11/21/2023   K 4.2 11/21/2023   CL 104 11/21/2023   CO2 24 11/21/2023   BUN 20 11/21/2023   CREATININE 1.48 (H) 11/21/2023   EGFR 50 (L) 11/21/2023   CALCIUM  9.6 11/21/2023   PROT 6.3 11/21/2023   ALBUMIN 4.2 11/21/2023   LABGLOB 2.1 11/21/2023   AGRATIO 2.1 06/08/2017   BILITOT 0.7 11/21/2023   ALKPHOS 116 11/21/2023   AST 21 11/21/2023   ALT 18 11/21/2023   ANIONGAP 6 10/01/2018   Last lipids Lab Results  Component Value Date   CHOL 150 11/21/2023  HDL 47 11/21/2023   LDLCALC 79 11/21/2023   TRIG 134 11/21/2023   CHOLHDL 3.2 11/21/2023   Last hemoglobin A1c Lab Results  Component Value Date   HGBA1C 6.0 (H) 11/21/2023      The ASCVD Risk score (Arnett DK, et al., 2019) failed to calculate for the following reasons:   Risk score cannot be calculated because patient has a medical history suggesting prior/existing ASCVD    Assessment & Plan:   Problem List Items Addressed This Visit       Cardiovascular and Mediastinum   Essential hypertension - Primary   He is currently prescribed losartan  50 mg daily.  BP is adequately controlled.  No medication changes are indicated today.      Relevant Orders   CMP14+EGFR     Genitourinary   CKD stage 3a, GFR 45-59 ml/min (HCC)   Recent labs are consistent with CKD 3A.  Currently on ARB.  Repeat labs ordered today.      Relevant Orders   Urine Microalbumin w/creat. ratio      Other   Hyperlipidemia   Will check lipids today with goal LDL < 70.  Currently on atorvastatin  80 mg. No refills or changes made today.       Relevant Orders   Lipid Profile   CMP14+EGFR   Prediabetes   Check fasting labs including A1C today.  F/U according to lab results.       Relevant Orders   HgB A1c   CMP14+EGFR    No follow-ups on file.    Leita Longs, FNP

## 2024-05-22 NOTE — Assessment & Plan Note (Signed)
 He is currently prescribed losartan 50 mg daily.  BP is adequately controlled.  No medication changes are indicated today.

## 2024-05-22 NOTE — Assessment & Plan Note (Signed)
 Will check lipids today with goal LDL < 70.  Currently on atorvastatin  80 mg. No refills or changes made today.

## 2024-05-22 NOTE — Assessment & Plan Note (Signed)
 Check fasting labs including A1C today.  F/U according to lab results.

## 2024-05-22 NOTE — Assessment & Plan Note (Signed)
 Recent labs are consistent with CKD 3A.  Currently on ARB.  Repeat labs ordered today.

## 2024-05-26 ENCOUNTER — Telehealth (INDEPENDENT_AMBULATORY_CARE_PROVIDER_SITE_OTHER): Payer: Self-pay | Admitting: Gastroenterology

## 2024-05-26 ENCOUNTER — Encounter (INDEPENDENT_AMBULATORY_CARE_PROVIDER_SITE_OTHER): Payer: Self-pay

## 2024-05-26 NOTE — Telephone Encounter (Signed)
 Thanks.  No need to proceed with any EGD as he denied having any dysphagia when he saw me in clinic or ongoing GERD symptoms while taking the medication.

## 2024-05-26 NOTE — Progress Notes (Deleted)
 Cardiology Office Note   Date:  05/26/2024   ID:  Terry Powell, DOB 05/28/1951, MRN [id number]  PCP:  Bevely Doffing, FNP  Cardiologist:  Dr. Rolan      History of Present Illness: Terry Powell is a 73 y.o. male who presents for CAD and hyperlipidemia, HTN follow up.  First seen by me August 2018  He has a history of HTN and hyperlipidemia.  Seen by Dr. Rolan 01/2015.  In 06/18/15, patient developed severe substernal chest pain.  He went to the ER at [facility].  Cardiac enzymes were positive, so he was sent to [facility].  Left heart cath showed EF 55% and plaque + significant mobile thrombus in the proximal LAD creating about 80% stenosis.  He was treated with ticagrelor and heparin gtt.  Re-look cath several days later showed only  20-25% proximal LAD stenosis and resolution of the thrombus.  Ticagrelor was continued for a year then stopped.  He had a non ischemic myovue 02/2015    Lives alone Mother next to him is close to 90   F/U myovue 11/04/18 no ischemia EF 57%  Echo 11/04/18 EF 60-65% no significant valve disease   Tends to run low HR mostly in mid 50's not on beta blocker ECG from primary reviewed asymptomatic but SB rate 46 with no AV block and otherwise normal 06/02/19   Chronic reflux Rx protonix  Follows with GI previous polyp removal Due repeat colon this year  Taking Linzess for IBS/constipation   Seen by Neurology for neck pain and vertigo like symptoms with gait instability Sent to PT and given Flexeril  Cerumen impaction and Meclizine  MRI with severe foraminal stenosis at C4-5 level Trying to put off any surgery on his neck Has had two injections last one a year ago not as effective   He has been quite sedentary and discussed going to [facility] to walk on treadmill more   ***     Past Medical History:  Diagnosis Date   Allergy    IV dye, seasonal   Anxiety    Arthritis    Blood transfusion without reported diagnosis    Cataract    Clotting disorder (HCC)     Depression    since back surgery   GERD (gastroesophageal reflux disease) 04/30/2018   Gout    Hypercholesteremia    Hypertension    Myocardial infarction (HCC)    Solitary kidney, congenital     Past Surgical History:  Procedure Laterality Date   ANKLE FRACTURE SURGERY     BACK SURGERY     BIOPSY  08/23/2018   Procedure: BIOPSY;  Surgeon: Harvey Margo CROME, MD;  Location: [facility];  Service: Endoscopy;;  biopsy of cardia   COLONOSCOPY  06/08/2011   Dr. harvey: 2 sessile polyps removed from the descending colon.  One was lower adenoma.  Diverticular also seen.  Hemorrhoids.TCS recommended in 2017.    COLONOSCOPY N/A 08/23/2018   Surgeon: Harvey Margo CROME, MD; 8 mm tubular adenoma removed from the rectum, three 4-6 mm polyps removed from the descending and ascending colon (benign), diverticulosis, internal hemorrhoids.  Recommended 5-year repeat.   ESOPHAGEAL DILATION  06/08/2011   Procedure: ESOPHAGEAL DILATION;  Surgeon: Margo CHRISTELLA Harvey, MD;  Location: [facility];  Service: Endoscopy;;   ESOPHAGOGASTRODUODENOSCOPY  06/08/2011   Dr. harvey: Leftward duodenum, esophageal stricture status post dilation, hiatal hernia, gastritis/duodenitis..  No H. pylori on path.   ESOPHAGOGASTRODUODENOSCOPY N/A 08/23/2018   Surgeon: Harvey Margo CROME, MD; benign-appearing  esophageal stricture due to uncontrolled GERD s/p dilation, gastritis/duodenitis due to ASA/NSAIDs biopsied, small hiatal hernia.  Pathology with inflamed squamoglandular mucosa, no H. pylori.   POLYPECTOMY  08/23/2018   Procedure: POLYPECTOMY;  Surgeon: Harvey Margo CROME, MD;  Location: [facility];  Service: Endoscopy;;  polyp at ascending colon, descending colon polyps x2, rectal polyp,   SAVORY DILATION N/A 08/23/2018   Procedure: SAVORY DILATION;  Surgeon: Harvey Margo CROME, MD;  Location: [facility];  Service: Endoscopy;  Laterality: N/A;   SPINE SURGERY  1996   2-3 discs injured , fusion   TONSILLECTOMY     VASECTOMY        Current Outpatient Medications  Medication Sig Dispense Refill   aspirin EC 81 MG tablet Take 1 tablet (81 mg total) by mouth daily.     atorvastatin  (LIPITOR) 80 MG tablet Take 1 tablet by mouth once daily 30 tablet 0   calcium  carbonate (TUMS - DOSED IN MG ELEMENTAL CALCIUM ) 500 MG chewable tablet Chew 1 tablet by mouth as needed for indigestion or heartburn.     cholecalciferol (VITAMIN D ) 1000 units tablet Take 1,000 Units by mouth daily.     Colchicine  (MITIGARE ) 0.6 MG CAPS Take 2 pills at the first sign of gout.  Repeat dose at one hour Then one a day until gone 30 capsule 11   cyanocobalamin (VITAMIN B12) 1000 MCG tablet Take 1,000 mcg by mouth daily.     docusate sodium (COLACE) 100 MG capsule Take 300 mg by mouth daily as needed for moderate constipation.      FLUoxetine (PROZAC) 20 MG capsule Take 20 mg by mouth daily.      losartan  (COZAAR ) 50 MG tablet Take 1 tablet by mouth once daily 30 tablet 0   Na Sulfate-K Sulfate-Mg Sulf 17.5-3.13-1.6 GM/177ML SOLN Take 1 kit by mouth as directed. 354 mL 0   nitroGLYCERIN  (NITROSTAT ) 0.4 MG SL tablet Place 1 tablet (0.4 mg total) under the tongue every 5 (five) minutes as needed. 25 tablet 2   pantoprazole  (PROTONIX ) 40 MG tablet TAKE 1 TABLET BY MOUTH 30 MINUTES PRIOR TO FIRST MAIN MEAL OF THE DAY 90 tablet 3   polyethylene glycol (MIRALAX  / GLYCOLAX ) packet Take 17 g by mouth daily as needed.      tamsulosin (FLOMAX) 0.4 MG CAPS capsule Take 0.4 mg by mouth daily.     No current facility-administered medications for this visit.    Allergies:   Iodinated contrast media    Social History:  The patient  reports that he has never smoked. He has never used smokeless tobacco. He reports that he does not drink alcohol  and does not use drugs.   Family History:  The patient's family history includes Arthritis in his father and mother; Asthma in his mother; COPD in his mother; Cancer (age of onset: 56) in his maternal grandfather; Colon  cancer (age of onset: 58) in his maternal grandfather; Depression in his mother; Diabetes in his mother; Heart disease (age of onset: 15) in his father; Hyperlipidemia in his father and mother; Hypertension in his mother; Kidney disease in his mother; Stroke in his maternal grandfather and paternal grandfather; Ulcers in his mother; Ulcers (age of onset: 74) in his father.    ROS:  General:no colds or fevers, + weight increase from last year Skin:no rashes or ulcers HEENT:no blurred vision, no congestion CV:see HPI PUL:see HPI GI:no diarrhea constipation or melena, no indigestion GU:no hematuria, no dysuria MS:no joint pain, no  claudication Neuro:no syncope, no lightheadedness Endo:no diabetes, no thyroid  disease  Wt Readings from Last 3 Encounters:  05/20/24 164 lb 1.3 oz (74.4 kg)  05/15/24 165 lb 11.2 oz (75.2 kg)  11/20/23 167 lb 3.2 oz (75.8 kg)     PHYSICAL EXAM: VS:  There were no vitals taken for this visit. , BMI There is no height or weight on file to calculate BMI. Affect appropriate Healthy:  appears stated age HEENT: normal Neck supple with no adenopathy JVP normal no bruits no thyromegaly Lungs clear with no wheezing and good diaphragmatic motion Heart:  S1/S2 no murmur, no rub, gallop or click PMI normal Abdomen: benighn, BS positve, no tenderness, no AAA no bruit.  No HSM or HJR Distal pulses intact with no bruits No edema Neuro non-focal Skin warm and dry No muscular weakness   EKG:  05/26/2024 SR rate 58 normal    Recent Labs: 11/21/2023: ALT 18; BUN 20; Creatinine, Ser 1.48; Hemoglobin 17.1; Platelets 190; Potassium 4.2; Sodium 143; TSH 2.770    Lipid Panel    Component Value Date/Time   CHOL 150 11/21/2023 1045   TRIG 134 11/21/2023 1045   HDL 47 11/21/2023 1045   CHOLHDL 3.2 11/21/2023 1045   CHOLHDL 3.8 06/29/2016 0944   VLDL 31 (H) 06/29/2016 0944   LDLCALC 79 11/21/2023 1045     Other studies Reviewed: Additional studies/ records that  were reviewed today include: Notes Dr Rolan and PA Leita Lobstein Myovue 2016 And cath films from 2016  Nuc study 02/2015 .  Overall Impression:  Low risk stress nuclear study with a small, mild, partially reversible inferior defect consistent with inferior thinning and possible minimal inferior ischemia.   LV Ejection Fraction: 54%.  LV Wall Motion:  NL LV Function; NL Wall Motion  Nuc study 11/04/18   Probable normal perfusion and mild soft tissue attenuation (diaphragm) No significant ischemia or scar There was no ST segment deviation noted during stress. Nuclear stress EF: 57%. This is a low risk study.  Echo: 11/04/18  Study Conclusions   - Left ventricle: The cavity size was normal. Wall thickness was   increased in a pattern of mild LVH. Systolic function was normal.   The estimated ejection fraction was in the range of 60% to 65%.   The study is not technically sufficient to allow evaluation of LV   diastolic function.   ASSESSMENT AND PLAN:   1. CAD:  Thrombotic lesion LAD 2012 resolved with anticoagulation. Normal myovue 2016 and again 11/04/18 with no ischemia and EF 57%   stable no beta blocker due to bradycardia continue ASA and statin  ECG normal today    2. Bradycardia: Asymptomatic, good chronotropic response to exercise Avoid beta blockers   3. Hyperlipidemia: on high dose statin labs with primary LDL 84   4. Renal:  History of solitary kidney and enlarged prostate Baseline Cr around 1.4 labs with primary on ARB for renal protection  5. GI:  continue protonix  for reflux f/u GI arrange colonoscopy 2024 previous polyp removal   6. Neck Pain:  ? Need for neurontin and neurosurgical consult for C34 stenosis per Neurology Dr Camara MRI with C45 foraminal narrowing EMG no active radiculopathy and mild right carpal tunnel  F/U in a year   Maude Emmer, MD

## 2024-05-26 NOTE — Telephone Encounter (Signed)
 Pt came into office to reschedule TCS. Pt states he can not find anyone to give him a ride that early in the morning. Pt rescheduled from 06/06/24 at 9:00am  to 06/11/24 at 11:00am. New instructions given to pt.  Pt wanted to know if provider would like to do an EGD also at time of TCS. Please advise. Thank you

## 2024-05-26 NOTE — Telephone Encounter (Signed)
 Pt contacted and verbalized understanding.

## 2024-05-27 ENCOUNTER — Other Ambulatory Visit: Payer: Self-pay | Admitting: Urology

## 2024-05-27 DIAGNOSIS — R972 Elevated prostate specific antigen [PSA]: Secondary | ICD-10-CM

## 2024-05-29 ENCOUNTER — Ambulatory Visit: Admitting: Cardiovascular Disease

## 2024-05-29 LAB — CMP14+EGFR
ALT: 22 IU/L (ref 0–44)
AST: 25 IU/L (ref 0–40)
Albumin: 4.4 g/dL (ref 3.8–4.8)
Alkaline Phosphatase: 107 IU/L (ref 44–121)
BUN/Creatinine Ratio: 10 (ref 10–24)
BUN: 13 mg/dL (ref 8–27)
Bilirubin Total: 0.9 mg/dL (ref 0.0–1.2)
CO2: 22 mmol/L (ref 20–29)
Calcium: 9.7 mg/dL (ref 8.6–10.2)
Chloride: 106 mmol/L (ref 96–106)
Creatinine, Ser: 1.26 mg/dL (ref 0.76–1.27)
Globulin, Total: 1.7 g/dL (ref 1.5–4.5)
Glucose: 108 mg/dL — ABNORMAL HIGH (ref 70–99)
Potassium: 4.7 mmol/L (ref 3.5–5.2)
Sodium: 145 mmol/L — ABNORMAL HIGH (ref 134–144)
Total Protein: 6.1 g/dL (ref 6.0–8.5)
eGFR: 61 mL/min/1.73 (ref >59)

## 2024-05-29 LAB — MICROALBUMIN / CREATININE URINE RATIO
Creatinine, Urine: 189.2 mg/dL
Microalb/Creat Ratio: 4 mg/g{creat} (ref 0–29)
Microalbumin, Urine: 7.3 ug/mL

## 2024-05-29 LAB — LIPID PANEL
Chol/HDL Ratio: 3.3 ratio (ref 0.0–5.0)
Cholesterol, Total: 141 mg/dL (ref 100–199)
HDL: 43 mg/dL (ref >39)
LDL Chol Calc (NIH): 77 mg/dL (ref 0–99)
Triglycerides: 113 mg/dL (ref 0–149)
VLDL Cholesterol Cal: 21 mg/dL (ref 5–40)

## 2024-05-29 LAB — HEMOGLOBIN A1C
Est. average glucose Bld gHb Est-mCnc: 126 mg/dL
Hgb A1c MFr Bld: 6 % — ABNORMAL HIGH (ref 4.8–5.6)

## 2024-06-11 ENCOUNTER — Ambulatory Visit (HOSPITAL_COMMUNITY)

## 2024-06-11 ENCOUNTER — Ambulatory Visit (HOSPITAL_COMMUNITY)
Admission: RE | Admit: 2024-06-11 | Discharge: 2024-06-11 | Disposition: A | Discharge status: Home / Self Care | Attending: Gastroenterology | Admitting: Gastroenterology

## 2024-06-11 ENCOUNTER — Encounter (HOSPITAL_COMMUNITY): Payer: Self-pay | Admitting: Gastroenterology

## 2024-06-11 ENCOUNTER — Other Ambulatory Visit: Payer: Self-pay

## 2024-06-11 ENCOUNTER — Encounter (HOSPITAL_COMMUNITY)
Admission: RE | Disposition: A | Discharge status: Home / Self Care | Payer: Self-pay | Source: Home / Self Care | Attending: Gastroenterology

## 2024-06-11 DIAGNOSIS — I2510 Atherosclerotic heart disease of native coronary artery without angina pectoris: Secondary | ICD-10-CM | POA: Insufficient documentation

## 2024-06-11 DIAGNOSIS — K5730 Diverticulosis of large intestine without perforation or abscess without bleeding: Secondary | ICD-10-CM | POA: Insufficient documentation

## 2024-06-11 DIAGNOSIS — D120 Benign neoplasm of cecum: Secondary | ICD-10-CM | POA: Diagnosis not present

## 2024-06-11 DIAGNOSIS — K5140 Inflammatory polyps of colon without complications: Secondary | ICD-10-CM | POA: Diagnosis not present

## 2024-06-11 DIAGNOSIS — D125 Benign neoplasm of sigmoid colon: Secondary | ICD-10-CM | POA: Diagnosis not present

## 2024-06-11 DIAGNOSIS — I252 Old myocardial infarction: Secondary | ICD-10-CM | POA: Insufficient documentation

## 2024-06-11 DIAGNOSIS — K219 Gastro-esophageal reflux disease without esophagitis: Secondary | ICD-10-CM | POA: Insufficient documentation

## 2024-06-11 DIAGNOSIS — K5900 Constipation, unspecified: Secondary | ICD-10-CM | POA: Diagnosis not present

## 2024-06-11 DIAGNOSIS — Z1211 Encounter for screening for malignant neoplasm of colon: Secondary | ICD-10-CM | POA: Diagnosis not present

## 2024-06-11 DIAGNOSIS — M109 Gout, unspecified: Secondary | ICD-10-CM | POA: Diagnosis not present

## 2024-06-11 DIAGNOSIS — F419 Anxiety disorder, unspecified: Secondary | ICD-10-CM | POA: Diagnosis not present

## 2024-06-11 DIAGNOSIS — I10 Essential (primary) hypertension: Secondary | ICD-10-CM | POA: Insufficient documentation

## 2024-06-11 DIAGNOSIS — F32A Depression, unspecified: Secondary | ICD-10-CM | POA: Diagnosis not present

## 2024-06-11 DIAGNOSIS — Z79899 Other long term (current) drug therapy: Secondary | ICD-10-CM | POA: Insufficient documentation

## 2024-06-11 DIAGNOSIS — E785 Hyperlipidemia, unspecified: Secondary | ICD-10-CM | POA: Diagnosis not present

## 2024-06-11 DIAGNOSIS — Z860101 Personal history of adenomatous and serrated colon polyps: Secondary | ICD-10-CM

## 2024-06-11 HISTORY — PX: COLONOSCOPY: SHX5424

## 2024-06-11 SURGERY — COLONOSCOPY
Anesthesia: General

## 2024-06-11 MED ORDER — LACTATED RINGERS IV SOLN: INTRAVENOUS | Status: DC

## 2024-06-11 MED ORDER — PROPOFOL 500 MG/50ML IV EMUL
INTRAVENOUS | Status: DC | PRN
  Administered 2024-06-11: 200 ug/kg/min via INTRAVENOUS
  Administered 2024-06-11: 100 mg via INTRAVENOUS

## 2024-06-11 MED ORDER — LIDOCAINE 2% (20 MG/ML) 5 ML SYRINGE
INTRAMUSCULAR | Status: DC | PRN
  Administered 2024-06-11: 60 mg via INTRAVENOUS

## 2024-06-11 NOTE — Interval H&P Note (Signed)
 History and Physical Interval Note:  06/11/2024 11:28 AM  Terry Powell  has presented today for surgery, with the diagnosis of history of polyps.  The various methods of treatment have been discussed with the patient and family. After consideration of risks, benefits and other options for treatment, the patient has consented to  Procedure(s) with comments: COLONOSCOPY (N/A) - 9:00 am, asa 1 as a surgical intervention.  The patient's history has been reviewed, patient examined, no change in status, stable for surgery.  I have reviewed the patient's chart and labs.  Questions were answered to the patient's satisfaction.     [name]

## 2024-06-11 NOTE — Op Note (Signed)
 [facility] Patient Name: Terry Powell Procedure Date: 06/11/2024 11:06 AM MRN: [id number] Date of Birth: May 04, 1951 Attending MD: Toribio Fortune , , [id number] CSN: [id number] Age: 73 Admit Type: Outpatient Procedure:                Colonoscopy Indications:              Surveillance: Personal history of adenomatous                            polyps on last colonoscopy > 5 years ago Providers:                Toribio Fortune, Crystal Page, Jon Loge Referring MD:              Medicines:                Monitored Anesthesia Care Complications:            No immediate complications. Estimated Blood Loss:     Estimated blood loss: none. Procedure:                Pre-Anesthesia Assessment:                           - Prior to the procedure, a History and Physical                            was performed, and patient medications, allergies                            and sensitivities were reviewed. The patient's                            tolerance of previous anesthesia was reviewed.                           - The risks and benefits of the procedure and the                            sedation options and risks were discussed with the                            patient. All questions were answered and informed                            consent was obtained.                           - ASA Grade Assessment: II - A patient with mild                            systemic disease.                           After obtaining informed consent, the colonoscope                            was passed under direct  vision. Throughout the                            procedure, the patient's blood pressure, pulse, and                            oxygen saturations were monitored continuously. The                            [id number]-HQ190L ([id number]) scope was introduced through                            the anus and advanced to the the cecum, identified                            by appendiceal  orifice and ileocecal valve. The                            colonoscopy was performed without difficulty. The                            patient tolerated the procedure well. The quality                            of the bowel preparation was good. Scope In: 11:36:37 AM Scope Out: 11:55:53 AM Scope Withdrawal Time: 0 hours 12 minutes 38 seconds  Total Procedure Duration: 0 hours 19 minutes 16 seconds  Findings:      The perianal and digital rectal examinations were normal.      Four semi-sessile polyps were found in the transverse colon and cecum.       The polyps were 2 to 5 mm in size. These polyps were removed with a cold       snare. Resection and retrieval were complete.      A 5 mm polyp was found in the sigmoid colon. The polyp was sessile. The       polyp was removed with a cold snare. Resection and retrieval were       complete.      A few small-mouthed diverticula were found in the sigmoid colon.      The retroflexed view of the distal rectum and anal verge was normal and       showed no anal or rectal abnormalities. Impression:               - Four 2 to 5 mm polyps in the transverse colon and                            in the cecum, removed with a cold snare. Resected                            and retrieved.                           - One 5 mm polyp in the sigmoid colon, removed with  a cold snare. Resected and retrieved.                           - Diverticulosis in the sigmoid colon.                           - The distal rectum and anal verge are normal on                            retroflexion view. Moderate Sedation:      Per Anesthesia Care Recommendation:           - Discharge patient to home (ambulatory).                           - Resume previous diet.                           - Await pathology results.                           - Repeat colonoscopy for surveillance based on                            pathology results. Procedure  Code(s):        --- Professional ---                           [phone], Colonoscopy, flexible; with removal of                            tumor(s), polyp(s), or other lesion(s) by snare                            technique Diagnosis Code(s):        --- Professional ---                           Z86.010, Personal history of colonic polyps                           D12.3, Benign neoplasm of transverse colon (hepatic                            flexure or splenic flexure)                           D12.0, Benign neoplasm of cecum                           D12.5, Benign neoplasm of sigmoid colon                           K57.30, Diverticulosis of large intestine without                            perforation or abscess without bleeding CPT copyright 2022 American Medical Association.  All rights reserved. The codes documented in this report are preliminary and upon coder review may  be revised to meet current compliance requirements. Toribio Fortune, MD Toribio Fortune,  06/11/2024 12:04:21 PM This report has been signed electronically. Number of Addenda: 0

## 2024-06-11 NOTE — Transfer of Care (Signed)
 Immediate Anesthesia Transfer of Care Note  Patient: Terry Powell  Procedure(s) Performed: COLONOSCOPY  Patient Location: Endoscopy Unit  Anesthesia Type:General  Level of Consciousness: drowsy  Airway & Oxygen Therapy: Patient Spontanous Breathing  Post-op Assessment: Report given to RN and Post -op Vital signs reviewed and stable  Post vital signs: Reviewed and stable  Last Vitals:  Vitals Value Taken Time  BP 85/44 06/11/24 11:58  Temp 36.6 C 06/11/24 11:58  Pulse 49 06/11/24 11:58  Resp 17 06/11/24 11:58  SpO2 97 % 06/11/24 11:58    Last Pain:  Vitals:   06/11/24 1158  TempSrc: Oral  PainSc:       Patients Stated Pain Goal: 8 (06/11/24 0918)  Complications: No notable events documented.

## 2024-06-11 NOTE — Anesthesia Preprocedure Evaluation (Addendum)
 Anesthesia Evaluation  Patient identified by MRN, date of birth, ID band Patient awake    Reviewed: Allergy & Precautions, H&P , NPO status , Patient's Chart, lab work & pertinent test results  Airway Mallampati: II  TM Distance: >3 FB Neck ROM: Full    Dental no notable dental hx.    Pulmonary neg pulmonary ROS   Pulmonary exam normal breath sounds clear to auscultation       Cardiovascular hypertension, + CAD and + Past MI  Normal cardiovascular exam Rhythm:Regular Rate:Normal  Possible MI 13 years ago/medical management Negative stress test since EF 57%   Neuro/Psych  PSYCHIATRIC DISORDERS Anxiety Depression    negative neurological ROS     GI/Hepatic Neg liver ROS,GERD  ,,  Endo/Other  negative endocrine ROS    Renal/GU Renal disease  negative genitourinary   Musculoskeletal  (+) Arthritis ,    Abdominal   Peds negative pediatric ROS (+)  Hematology negative hematology ROS (+)   Anesthesia Other Findings   Reproductive/Obstetrics negative OB ROS                              Anesthesia Physical Anesthesia Plan  ASA: 3  Anesthesia Plan: General   Post-op Pain Management:    Induction: Intravenous  PONV Risk Score and Plan:   Airway Management Planned: Nasal Cannula  Additional Equipment:   Intra-op Plan:   Post-operative Plan:   Informed Consent: I have reviewed the patients History and Physical, chart, labs and discussed the procedure including the risks, benefits and alternatives for the proposed anesthesia with the patient or authorized representative who has indicated his/her understanding and acceptance.     Dental advisory given  Plan Discussed with: CRNA  Anesthesia Plan Comments:          Anesthesia Quick Evaluation

## 2024-06-11 NOTE — Anesthesia Postprocedure Evaluation (Signed)
 Anesthesia Post Note  Patient: Cochise Dinneen  Procedure(s) Performed: COLONOSCOPY  Patient location during evaluation: PACU Anesthesia Type: General Level of consciousness: awake and alert Pain management: pain level controlled Vital Signs Assessment: post-procedure vital signs reviewed and stable Respiratory status: spontaneous breathing, nonlabored ventilation, respiratory function stable and patient connected to nasal cannula oxygen Cardiovascular status: stable and blood pressure returned to baseline Postop Assessment: no apparent nausea or vomiting Anesthetic complications: no   No notable events documented.   Last Vitals:  Vitals:   06/11/24 1158 06/11/24 1210  BP: (!) 85/44 (!) 93/54  Pulse: (!) 49 (!) 45  Resp: 17 15  Temp: 36.6 C   SpO2: 97% 95%    Last Pain:  Vitals:   06/11/24 1158  TempSrc: Oral  PainSc:                  Andrea Limes

## 2024-06-11 NOTE — Discharge Instructions (Signed)
 You are being discharged to home.  Resume your previous diet.  We are waiting for your pathology results.  Your physician has recommended a repeat colonoscopy for surveillance based on pathology results.

## 2024-06-12 ENCOUNTER — Encounter (HOSPITAL_COMMUNITY): Payer: Self-pay | Admitting: Gastroenterology

## 2024-06-12 ENCOUNTER — Telehealth: Payer: Self-pay

## 2024-06-12 LAB — SURGICAL PATHOLOGY

## 2024-06-12 NOTE — Telephone Encounter (Signed)
 Copied from CRM #[id number]. Topic: Clinical - Lab/Test Results >> Jun 12, 2024  9:43 AM Charlet HERO wrote: Reason for CRM: Patient is calling to go over his lab work, informed the provider will contact him when they have been reviewed. Let patient know he will get a call as soon as the provider has reviewed.

## 2024-06-13 ENCOUNTER — Ambulatory Visit: Payer: Self-pay | Admitting: Gastroenterology

## 2024-06-13 ENCOUNTER — Other Ambulatory Visit: Payer: Self-pay | Admitting: Cardiovascular Disease

## 2024-06-16 ENCOUNTER — Ambulatory Visit: Payer: Self-pay

## 2024-06-16 ENCOUNTER — Encounter (INDEPENDENT_AMBULATORY_CARE_PROVIDER_SITE_OTHER): Payer: Self-pay | Admitting: *Deleted

## 2024-06-16 NOTE — Progress Notes (Signed)
 5 yr TCS noted in recall Patient result letter mailed Patient's PCP is on EPIC

## 2024-06-25 ENCOUNTER — Ambulatory Visit

## 2024-07-18 ENCOUNTER — Other Ambulatory Visit: Payer: Self-pay | Admitting: Cardiovascular Disease

## 2024-08-14 NOTE — Progress Notes (Signed)
 Cardiology Office Note   Date:  08/22/2024   ID:  Terry Powell, DOB 17-Nov-1950, MRN [id number]  PCP:  Bevely Doffing, FNP  Cardiologist:  Dr. Rolan      History of Present Illness: Terry Powell is a 73 y.o. male who presents for CAD and hyperlipidemia, HTN follow up.  First seen by me August 2018  He has a history of HTN and hyperlipidemia.  Seen by Dr. Rolan 01/2015.  In 06/18/15, patient developed severe substernal chest pain.  He went to the ER at [facility].  Cardiac enzymes were positive, so he was sent to [facility].  Left heart cath showed EF 55% and plaque + significant mobile thrombus in the proximal LAD creating about 80% stenosis.  He was treated with ticagrelor and heparin gtt.  Re-look cath several days later showed only  20-25% proximal LAD stenosis and resolution of the thrombus.  Ticagrelor was continued for a year then stopped.  He had a non ischemic myovue 02/2015    Lives alone Mother next to him is close to 90   F/U myovue 11/04/18 no ischemia EF 57%  Echo 11/04/18 EF 60-65% no significant valve disease   Tends to run low HR mostly in mid 50's not on beta blocker ECG from primary reviewed asymptomatic but SB rate 46 with no AV block and otherwise normal 06/02/19   Chronic reflux Rx protonix  Follows with GI previous polyp removal Due repeat colon this year  Taking Linzess for IBS/constipation   Seen by Neurology for neck pain and vertigo like symptoms with gait instability Sent to PT and given Flexeril  Cerumen impaction and Meclizine  MRI with severe foraminal stenosis at C4-5 level Trying to put off any surgery on his neck Has had two injections last one a year ago not as effective   He has been quite sedentary and discussed going to [facility] to walk on treadmill more   Retired Chartered loss adjuster. Daughter from [place]  retired from [employer] and living with him and his grandson Has another daughter in [place]      Past Medical History:  Diagnosis Date   Allergy    IV  dye, seasonal   Anxiety    Arthritis    Blood transfusion without reported diagnosis    Cataract    Clotting disorder    Depression    since back surgery   GERD (gastroesophageal reflux disease) 04/30/2018   Gout    Hypercholesteremia    Hypertension    Myocardial infarction (HCC)    Solitary kidney, congenital     Past Surgical History:  Procedure Laterality Date   ANKLE FRACTURE SURGERY     BACK SURGERY     BIOPSY  08/23/2018   Procedure: BIOPSY;  Surgeon: Harvey Margo CROME, MD;  [facility];  Service: Endoscopy;;  biopsy of cardia   COLONOSCOPY  06/08/2011   Dr. harvey: 2 sessile polyps removed from the descending colon.  One was lower adenoma.  Diverticular also seen.  Hemorrhoids.TCS recommended in 2017.    COLONOSCOPY N/A 08/23/2018   Surgeon: Harvey Margo CROME, MD; 8 mm tubular adenoma removed from the rectum, three 4-6 mm polyps removed from the descending and ascending colon (benign), diverticulosis, internal hemorrhoids.  Recommended 5-year repeat.   COLONOSCOPY N/A 06/11/2024   Procedure: COLONOSCOPY;  Surgeon: Eartha Angelia Sieving, MD;  Location: [facility];  Service: Gastroenterology;  Laterality: N/A;  9:00 am, asa 1   ESOPHAGEAL DILATION  06/08/2011   Procedure: ESOPHAGEAL DILATION;  Surgeon: Margo CHRISTELLA Haddock, MD;  Location: [facility];  Service: Endoscopy;;   ESOPHAGOGASTRODUODENOSCOPY  06/08/2011   Dr. haddock: Leftward duodenum, esophageal stricture status post dilation, hiatal hernia, gastritis/duodenitis..  No H. pylori on path.   ESOPHAGOGASTRODUODENOSCOPY N/A 08/23/2018   Surgeon: Haddock Margo CROME, MD; benign-appearing esophageal stricture due to uncontrolled GERD s/p dilation, gastritis/duodenitis due to ASA/NSAIDs biopsied, small hiatal hernia.  Pathology with inflamed squamoglandular mucosa, no H. pylori.   POLYPECTOMY  08/23/2018   Procedure: POLYPECTOMY;  Surgeon: Haddock Margo CROME, MD;  [facility]: [facility];  Service: Endoscopy;;  polyp at  ascending colon, descending colon polyps x2, rectal polyp,   SAVORY DILATION N/A 08/23/2018   Procedure: SAVORY DILATION;  Surgeon: Haddock Margo CROME, MD;  Location: [facility];  Service: Endoscopy;  Laterality: N/A;   SPINE SURGERY  1996   2-3 discs injured , fusion   TONSILLECTOMY     VASECTOMY       Current Outpatient Medications  Medication Sig Dispense Refill   aspirin EC 81 MG tablet Take 1 tablet (81 mg total) by mouth daily.     atorvastatin  (LIPITOR) 80 MG tablet Take 1 tablet (80 mg total) by mouth daily. 30 tablet 0   calcium  carbonate (TUMS - DOSED IN MG ELEMENTAL CALCIUM ) 500 MG chewable tablet Chew 1 tablet by mouth as needed for indigestion or heartburn.     cholecalciferol (VITAMIN D ) 1000 units tablet Take 1,000 Units by mouth daily.     Colchicine  (MITIGARE ) 0.6 MG CAPS Take 2 pills at the first sign of gout.  Repeat dose at one hour Then one a day until gone 30 capsule 11   cyanocobalamin (VITAMIN B12) 1000 MCG tablet Take 1,000 mcg by mouth daily.     docusate sodium (COLACE) 100 MG capsule Take 300 mg by mouth daily as needed for moderate constipation.      FLUoxetine (PROZAC) 20 MG capsule Take 20 mg by mouth daily.      losartan  (COZAAR ) 50 MG tablet Take 1 tablet by mouth once daily 15 tablet 0   nitroGLYCERIN  (NITROSTAT ) 0.4 MG SL tablet Place 1 tablet (0.4 mg total) under the tongue every 5 (five) minutes as needed. 25 tablet 2   pantoprazole  (PROTONIX ) 40 MG tablet TAKE 1 TABLET BY MOUTH 30 MINUTES PRIOR TO FIRST MAIN MEAL OF THE DAY 90 tablet 3   polyethylene glycol (MIRALAX  / GLYCOLAX ) packet Take 17 g by mouth daily as needed.      tamsulosin (FLOMAX) 0.4 MG CAPS capsule Take 0.4 mg by mouth daily.     No current facility-administered medications for this visit.    Allergies:   Iodinated contrast media    Social History:  The patient  reports that he has never smoked. He has never used smokeless tobacco. He reports that he does not drink alcohol  and does  not use drugs.   Family History:  The patient's family history includes Arthritis in his father and mother; Asthma in his mother; COPD in his mother; Cancer (age of onset: 37) in his maternal grandfather; Colon cancer (age of onset: 64) in his maternal grandfather; Depression in his mother; Diabetes in his mother; Heart disease (age of onset: 74) in his father; Hyperlipidemia in his father and mother; Hypertension in his mother; Kidney disease in his mother; Stroke in his maternal grandfather and paternal grandfather; Ulcers in his mother; Ulcers (age of onset: 71) in his father.    ROS:  General:no colds or fevers, +  weight increase from last year Skin:no rashes or ulcers HEENT:no blurred vision, no congestion CV:see HPI PUL:see HPI GI:no diarrhea constipation or melena, no indigestion GU:no hematuria, no dysuria MS:no joint pain, no claudication Neuro:no syncope, no lightheadedness Endo:no diabetes, no thyroid  disease  Wt Readings from Last 3 Encounters:  08/22/24 164 lb (74.4 kg)  06/11/24 164 lb (74.4 kg)  05/20/24 164 lb 1.3 oz (74.4 kg)     PHYSICAL EXAM: VS:  BP 126/80 (BP Location: Left Arm, Cuff Size: Normal)   Pulse (!) 48   Ht 5' 9 (1.753 m)   Wt 164 lb (74.4 kg)   SpO2 99%   BMI 24.22 kg/m  , BMI Body mass index is 24.22 kg/m. Affect appropriate Healthy:  appears stated age HEENT: normal Neck supple with no adenopathy JVP normal no bruits no thyromegaly Lungs clear with no wheezing and good diaphragmatic motion Heart:  S1/S2 no murmur, no rub, gallop or click PMI normal Abdomen: benighn, BS positve, no tenderness, no AAA no bruit.  No HSM or HJR Distal pulses intact with no bruits No edema Neuro non-focal Skin warm and dry No muscular weakness   EKG:  08/22/2024  sinus brady rate 48 no AV block    Recent Labs: 11/21/2023: Hemoglobin 17.1; Platelets 190; TSH 2.770 05/27/2024: ALT 22; BUN 13; Creatinine, Ser 1.26; Potassium 4.7; Sodium 145    Lipid  Panel    Component Value Date/Time   CHOL 141 05/27/2024 1050   TRIG 113 05/27/2024 1050   HDL 43 05/27/2024 1050   CHOLHDL 3.3 05/27/2024 1050   CHOLHDL 3.8 06/29/2016 0944   VLDL 31 (H) 06/29/2016 0944   LDLCALC 77 05/27/2024 1050     Other studies Reviewed: Additional studies/ records that were reviewed today include: Notes Dr Rolan and PA Leita Lobstein Myovue 2016 And cath films from 2016  Nuc study 02/2015 .  Overall Impression:  Low risk stress nuclear study with a small, mild, partially reversible inferior defect consistent with inferior thinning and possible minimal inferior ischemia.   LV Ejection Fraction: 54%.  LV Wall Motion:  NL LV Function; NL Wall Motion  Nuc study 11/04/18   Probable normal perfusion and mild soft tissue attenuation (diaphragm) No significant ischemia or scar There was no ST segment deviation noted during stress. Nuclear stress EF: 57%. This is a low risk study.  Echo: 11/04/18  Study Conclusions   - Left ventricle: The cavity size was normal. Wall thickness was   increased in a pattern of mild LVH. Systolic function was normal.   The estimated ejection fraction was in the range of 60% to 65%.   The study is not technically sufficient to allow evaluation of LV   diastolic function.   ASSESSMENT AND PLAN:   1. CAD:  Thrombotic lesion LAD 2012 resolved with anticoagulation. Normal myovue 2016 and again 11/04/18 with no ischemia and EF 57%   stable no beta blocker due to bradycardia continue ASA and statin  ECG normal today    2. Bradycardia: Asymptomatic, good chronotropic response to exercise Avoid beta blockers No AV block on ECG. He knows to call us  with any syncope or changes Suspect he will eventually need PPM but no indication now   3. Hyperlipidemia: on high dose statin labs with primary LDL 84   4. Renal:  History of solitary kidney and enlarged prostate Baseline Cr around 1.4 labs with primary   5. GI:  continue protonix  for  reflux f/u GI to arrange colonoscopy  previous polyp removal   6. Neck Pain:  ? Need for neurontin and neurosurgical consult for C34 stenosis per Neurology Dr Gregg   F/U in a year   Maude Emmer, MD

## 2024-08-17 ENCOUNTER — Other Ambulatory Visit: Payer: Self-pay | Admitting: Cardiovascular Disease

## 2024-08-20 ENCOUNTER — Ambulatory Visit

## 2024-08-21 ENCOUNTER — Other Ambulatory Visit

## 2024-08-22 ENCOUNTER — Encounter: Payer: Self-pay | Admitting: Cardiovascular Disease

## 2024-08-22 ENCOUNTER — Ambulatory Visit: Attending: Cardiovascular Disease | Admitting: Cardiovascular Disease

## 2024-08-22 VITALS — BP 126/80 | HR 48 | Ht 69.0 in | Wt 164.0 lb

## 2024-08-22 DIAGNOSIS — E782 Mixed hyperlipidemia: Secondary | ICD-10-CM | POA: Insufficient documentation

## 2024-08-22 DIAGNOSIS — I2510 Atherosclerotic heart disease of native coronary artery without angina pectoris: Secondary | ICD-10-CM | POA: Insufficient documentation

## 2024-08-22 DIAGNOSIS — R001 Bradycardia, unspecified: Secondary | ICD-10-CM | POA: Diagnosis present

## 2024-08-22 MED ORDER — PANTOPRAZOLE SODIUM 40 MG PO TBEC: DELAYED_RELEASE_TABLET | ORAL | 3 refills | Status: AC

## 2024-08-22 MED ORDER — ATORVASTATIN CALCIUM 80 MG PO TABS: 80.0000 mg | ORAL_TABLET | Freq: Every day | ORAL | 3 refills | Status: AC

## 2024-08-22 MED ORDER — LOSARTAN POTASSIUM 50 MG PO TABS: 50.0000 mg | ORAL_TABLET | Freq: Every day | ORAL | 3 refills | Status: AC

## 2024-08-22 NOTE — Patient Instructions (Signed)
 Medication Instructions:  Your physician recommends that you continue on your current medications as directed. Please refer to the Current Medication list given to you today.   Labwork: None today  Testing/Procedures: None today  Follow-Up: 6 months  Any Other Special Instructions Will Be Listed Below (If Applicable).  If you need a refill on your cardiac medications before your next appointment, please call your pharmacy.

## 2024-09-11 ENCOUNTER — Encounter (INDEPENDENT_AMBULATORY_CARE_PROVIDER_SITE_OTHER): Payer: Self-pay | Admitting: Gastroenterology

## 2024-09-20 ENCOUNTER — Ambulatory Visit
Admission: RE | Admit: 2024-09-20 | Discharge: 2024-09-20 | Disposition: A | Discharge status: Home / Self Care | Source: Ambulatory Visit | Attending: Urology | Admitting: Urology

## 2024-09-20 DIAGNOSIS — R972 Elevated prostate specific antigen [PSA]: Secondary | ICD-10-CM

## 2024-09-20 MED ORDER — GADOPICLENOL 0.5 MMOL/ML IV SOLN
7.0000 mL | Freq: Once | INTRAVENOUS | Status: AC | PRN
  Administered 2024-09-20: 7 mL via INTRAVENOUS

## 2025-01-30 ENCOUNTER — Ambulatory Visit: Admitting: Cardiovascular Disease
